# Patient Record
Sex: Female | Born: 1950
Health system: Southern US, Community
[De-identification: ages and names within clinical notes are randomized; demographics above are authoritative.]

## PROBLEM LIST (undated history)

## (undated) DIAGNOSIS — B009 Herpesviral infection, unspecified: Secondary | ICD-10-CM

## (undated) DIAGNOSIS — R112 Nausea with vomiting, unspecified: Secondary | ICD-10-CM

## (undated) DIAGNOSIS — F17200 Nicotine dependence, unspecified, uncomplicated: Secondary | ICD-10-CM

## (undated) DIAGNOSIS — F419 Anxiety disorder, unspecified: Secondary | ICD-10-CM

## (undated) DIAGNOSIS — J439 Emphysema, unspecified: Secondary | ICD-10-CM

## (undated) DIAGNOSIS — Z789 Other specified health status: Secondary | ICD-10-CM

## (undated) DIAGNOSIS — K219 Gastro-esophageal reflux disease without esophagitis: Secondary | ICD-10-CM

## (undated) DIAGNOSIS — Z9889 Other specified postprocedural states: Secondary | ICD-10-CM

## (undated) DIAGNOSIS — T7840XA Allergy, unspecified, initial encounter: Secondary | ICD-10-CM

## (undated) DIAGNOSIS — T8859XA Other complications of anesthesia, initial encounter: Secondary | ICD-10-CM

## (undated) DIAGNOSIS — T4145XA Adverse effect of unspecified anesthetic, initial encounter: Secondary | ICD-10-CM

## (undated) DIAGNOSIS — E785 Hyperlipidemia, unspecified: Secondary | ICD-10-CM

## (undated) DIAGNOSIS — IMO0001 Reserved for inherently not codable concepts without codable children: Secondary | ICD-10-CM

## (undated) DIAGNOSIS — E079 Disorder of thyroid, unspecified: Secondary | ICD-10-CM

## (undated) DIAGNOSIS — M1990 Unspecified osteoarthritis, unspecified site: Secondary | ICD-10-CM

## (undated) HISTORY — PX: DILATION AND CURETTAGE OF UTERUS: SHX78

## (undated) HISTORY — PX: CHOLECYSTECTOMY: SHX55

## (undated) HISTORY — DX: Herpesviral infection, unspecified: B00.9

## (undated) HISTORY — DX: Anxiety disorder, unspecified: F41.9

## (undated) HISTORY — DX: Hyperlipidemia, unspecified: E78.5

## (undated) HISTORY — DX: Disorder of thyroid, unspecified: E07.9

## (undated) HISTORY — DX: Unspecified osteoarthritis, unspecified site: M19.90

## (undated) HISTORY — DX: Nicotine dependence, unspecified, uncomplicated: F17.200

## (undated) HISTORY — DX: Emphysema, unspecified: J43.9

## (undated) HISTORY — DX: Allergy, unspecified, initial encounter: T78.40XA

## (undated) HISTORY — DX: Gastro-esophageal reflux disease without esophagitis: K21.9

---

## 1999-02-22 HISTORY — PX: GALLBLADDER SURGERY: SHX652

## 2000-08-14 ENCOUNTER — Other Ambulatory Visit: Admission: RE | Admit: 2000-08-14 | Discharge: 2000-08-14 | Payer: Self-pay | Admitting: Internal Medicine

## 2001-02-26 ENCOUNTER — Other Ambulatory Visit: Admission: RE | Admit: 2001-02-26 | Discharge: 2001-02-26 | Payer: Self-pay | Admitting: Internal Medicine

## 2005-12-30 ENCOUNTER — Ambulatory Visit (HOSPITAL_COMMUNITY): Admission: RE | Admit: 2005-12-30 | Discharge: 2005-12-30 | Payer: Self-pay | Admitting: Family Medicine

## 2007-12-21 ENCOUNTER — Ambulatory Visit (HOSPITAL_COMMUNITY): Admission: RE | Admit: 2007-12-21 | Discharge: 2007-12-21 | Payer: Self-pay | Admitting: Family Medicine

## 2011-06-02 ENCOUNTER — Encounter: Payer: Self-pay | Admitting: Gastroenterology

## 2011-10-21 ENCOUNTER — Other Ambulatory Visit (HOSPITAL_COMMUNITY): Payer: Self-pay | Admitting: Internal Medicine

## 2011-10-21 DIAGNOSIS — R220 Localized swelling, mass and lump, head: Secondary | ICD-10-CM

## 2011-10-25 ENCOUNTER — Other Ambulatory Visit (HOSPITAL_COMMUNITY): Payer: Self-pay

## 2011-10-28 ENCOUNTER — Ambulatory Visit (HOSPITAL_COMMUNITY)
Admission: RE | Admit: 2011-10-28 | Discharge: 2011-10-28 | Disposition: A | Discharge status: Home / Self Care | Payer: BC Managed Care – PPO | Source: Ambulatory Visit | Attending: Internal Medicine | Admitting: Internal Medicine

## 2011-10-28 DIAGNOSIS — E049 Nontoxic goiter, unspecified: Secondary | ICD-10-CM | POA: Insufficient documentation

## 2011-10-28 DIAGNOSIS — R221 Localized swelling, mass and lump, neck: Secondary | ICD-10-CM | POA: Insufficient documentation

## 2011-10-28 DIAGNOSIS — R220 Localized swelling, mass and lump, head: Secondary | ICD-10-CM | POA: Insufficient documentation

## 2011-11-10 ENCOUNTER — Encounter (INDEPENDENT_AMBULATORY_CARE_PROVIDER_SITE_OTHER): Payer: Self-pay | Admitting: *Deleted

## 2011-11-17 ENCOUNTER — Telehealth: Payer: Self-pay

## 2011-11-28 ENCOUNTER — Encounter (INDEPENDENT_AMBULATORY_CARE_PROVIDER_SITE_OTHER): Payer: Self-pay | Admitting: *Deleted

## 2011-12-07 ENCOUNTER — Other Ambulatory Visit (HOSPITAL_COMMUNITY)
Admission: RE | Admit: 2011-12-07 | Discharge: 2011-12-07 | Disposition: A | Discharge status: Home / Self Care | Payer: BC Managed Care – PPO | Source: Ambulatory Visit | Attending: Endocrinology | Admitting: Endocrinology

## 2011-12-07 ENCOUNTER — Ambulatory Visit (INDEPENDENT_AMBULATORY_CARE_PROVIDER_SITE_OTHER): Payer: BC Managed Care – PPO | Admitting: Endocrinology

## 2011-12-07 ENCOUNTER — Encounter: Payer: Self-pay | Admitting: Endocrinology

## 2011-12-07 VITALS — BP 122/74 | HR 87 | Temp 97.8°F | Wt 122.0 lb

## 2011-12-07 DIAGNOSIS — E041 Nontoxic single thyroid nodule: Secondary | ICD-10-CM | POA: Insufficient documentation

## 2011-12-07 NOTE — Patient Instructions (Addendum)
You will be contacted with biopsy results. If no cancer is seen, please come back for a follow-up appointment in 6-12 months

## 2011-12-07 NOTE — Progress Notes (Signed)
  Subjective:    Patient ID: Monique Lopez, female    DOB: 10-13-1950, 61 y.o.   MRN: [id number]  HPI 1 month ago, pt was incidentally noted on exam to have a slight nodule at the anterior neck.  No assoc pain. No past medical history on file.  No past surgical history on file.  History   Social History  . Marital Status: Widowed    Spouse Name: N/A    Number of Children: N/A  . Years of Education: N/A   Occupational History  . Not on file.   Social History Main Topics  . Smoking status: Current Every Day Smoker -- 1.0 packs/day for 40 years    Types: Cigarettes  . Smokeless tobacco: Not on file  . Alcohol Use: Not on file  . Drug Use: Not on file  . Sexually Active: Not on file   Other Topics Concern  . Not on file   Social History Narrative  . No narrative on file    No current outpatient prescriptions on file prior to visit.    Not on File  No family history on file. No goiter or other thyroid probs BP 122/74  Pulse 87  Temp 97.8 F (36.6 C) (Oral)  Wt 122 lb (55.339 kg)  SpO2 97%   Review of Systems denies headache, hoarseness, double vision, palpitations, diarrhea, polyuria, myalgias, excessive diaphoresis, numbness, tremor, anxiety, menopausal sxs, and easy bruising.  She has rhinorrhea and dry cough.  She has lost weight since the passing of her husband in 2008.      Objective:   Physical Exam VS: see vs page GEN: no distress HEAD: head: no deformity eyes: no periorbital swelling, no proptosis external nose and ears are normal mouth: no lesion seen NECK: supple, thyroid is not enlarged CHEST WALL: no deformity LUNGS:  Clear to auscultation CV: reg rate and rhythm, no murmur ABD: abdomen is soft, nontender.  no hepatosplenomegaly.  not distended.  no hernia MUSCULOSKELETAL: muscle bulk and strength are grossly normal.  no obvious joint swelling.  gait is normal and steady EXTEMITIES: no deformity.  no ulcer on the feet.  feet are of normal  color and temp.  no edema PULSES: dorsalis pedis intact bilat.  no carotid bruit NEURO:  cn 2-12 grossly intact.   readily moves all 4's.  sensation is intact to touch on the feet SKIN:  Normal texture and temperature.  No rash or suspicious lesion is visible.   NODES:  None palpable at the neck PSYCH: alert, oriented x3.  Does not appear anxious nor depressed.   outside test results are reviewed: TSH=normal  thyroid needle bx: consent obtained, signed form on chart The area is first sprayed with cooling agent local: xylocaine 2%, with epinephrine prep: alcohol pad bxs are done with 25 and 27g needles no complications  (i reviewed cytol report: no cancer is seen)    Assessment & Plan:  Thyroid nodule, new, low-risk for cancer Cough, not thyroid-related Weight-loss, not thyroid-related

## 2011-12-11 ENCOUNTER — Encounter: Payer: Self-pay | Admitting: Endocrinology

## 2011-12-11 DIAGNOSIS — T7840XA Allergy, unspecified, initial encounter: Secondary | ICD-10-CM | POA: Insufficient documentation

## 2011-12-11 DIAGNOSIS — E785 Hyperlipidemia, unspecified: Secondary | ICD-10-CM | POA: Insufficient documentation

## 2011-12-14 NOTE — Telephone Encounter (Signed)
Only work numbers. Mailing a letter for pt to call.

## 2011-12-14 NOTE — Telephone Encounter (Signed)
Letter to PCP also.  

## 2012-04-07 ENCOUNTER — Other Ambulatory Visit: Payer: Self-pay

## 2012-06-20 ENCOUNTER — Other Ambulatory Visit (HOSPITAL_COMMUNITY): Payer: Self-pay | Admitting: Family Medicine

## 2012-06-20 DIAGNOSIS — Z139 Encounter for screening, unspecified: Secondary | ICD-10-CM

## 2012-12-27 ENCOUNTER — Other Ambulatory Visit: Payer: Self-pay

## 2013-05-10 ENCOUNTER — Encounter (HOSPITAL_COMMUNITY): Payer: Self-pay | Admitting: Emergency Medicine

## 2013-05-10 ENCOUNTER — Emergency Department (HOSPITAL_COMMUNITY)
Admission: EM | Admit: 2013-05-10 | Discharge: 2013-05-10 | Disposition: A | Discharge status: Home / Self Care | Payer: BC Managed Care – PPO | Attending: Emergency Medicine | Admitting: Emergency Medicine

## 2013-05-10 ENCOUNTER — Emergency Department (HOSPITAL_COMMUNITY): Payer: BC Managed Care – PPO

## 2013-05-10 DIAGNOSIS — R0789 Other chest pain: Secondary | ICD-10-CM

## 2013-05-10 DIAGNOSIS — Y9389 Activity, other specified: Secondary | ICD-10-CM | POA: Insufficient documentation

## 2013-05-10 DIAGNOSIS — M5380 Other specified dorsopathies, site unspecified: Secondary | ICD-10-CM | POA: Insufficient documentation

## 2013-05-10 DIAGNOSIS — Y929 Unspecified place or not applicable: Secondary | ICD-10-CM | POA: Insufficient documentation

## 2013-05-10 DIAGNOSIS — M62838 Other muscle spasm: Secondary | ICD-10-CM

## 2013-05-10 DIAGNOSIS — IMO0002 Reserved for concepts with insufficient information to code with codable children: Secondary | ICD-10-CM | POA: Insufficient documentation

## 2013-05-10 DIAGNOSIS — F17200 Nicotine dependence, unspecified, uncomplicated: Secondary | ICD-10-CM | POA: Insufficient documentation

## 2013-05-10 DIAGNOSIS — R071 Chest pain on breathing: Secondary | ICD-10-CM | POA: Insufficient documentation

## 2013-05-10 DIAGNOSIS — E785 Hyperlipidemia, unspecified: Secondary | ICD-10-CM | POA: Insufficient documentation

## 2013-05-10 DIAGNOSIS — X500XXA Overexertion from strenuous movement or load, initial encounter: Secondary | ICD-10-CM | POA: Insufficient documentation

## 2013-05-10 MED ORDER — METHOCARBAMOL 500 MG PO TABS
500.0000 mg | ORAL_TABLET | Freq: Four times a day (QID) | ORAL | Status: AC
Start: 2013-05-10 — End: 2013-05-20

## 2013-05-10 MED ORDER — TRAMADOL HCL 50 MG PO TABS: 50.0000 mg | ORAL_TABLET | Freq: Four times a day (QID) | ORAL | Status: DC | PRN

## 2013-05-10 NOTE — ED Notes (Signed)
Pain in back and rt ribs for 2 weeks , since moved from passenger side of car to driver side.Today, coughed and felt a pop in rt ribs .and increase in pain in rt ribs.

## 2013-05-10 NOTE — Discharge Instructions (Signed)
Chest Wall Pain Chest wall pain is pain in or around the bones and muscles of your chest. It may take up to 6 weeks to get better. It may take longer if you must stay physically active in your work and activities.  CAUSES  Chest wall pain may happen on its own. However, it may be caused by:  A viral illness like the flu.  Injury.  Coughing.  Exercise.  Arthritis.  Fibromyalgia.  Shingles. HOME CARE INSTRUCTIONS   Avoid overtiring physical activity. Try not to strain or perform activities that cause pain. This includes any activities using your chest or your abdominal and side muscles, especially if heavy weights are used.  Put ice on the sore area.  Put ice in a plastic bag.  Place a towel between your skin and the bag.  Leave the ice on for 15-20 minutes per hour while awake for the first 2 days.  Only take over-the-counter or prescription medicines for pain, discomfort, or fever as directed by your caregiver. SEEK IMMEDIATE MEDICAL CARE IF:   Your pain increases, or you are very uncomfortable.  You have a fever.  Your chest pain becomes worse.  You have new, unexplained symptoms.  You have nausea or vomiting.  You feel sweaty or lightheaded.  You have a cough with phlegm (sputum), or you cough up blood. MAKE SURE YOU:   Understand these instructions.  Will watch your condition.  Will get help right away if you are not doing well or get worse. Document Released: 02/07/2005 Document Revised: 05/02/2011 Document Reviewed: 10/04/2010 [facility] Patient Information 2014 [place], [place].   Your x-rays are negative to date for any sign of bony injury (fracture).  You may use the medications prescribed if needed for pain and for muscle spasm.  Continue applying ice and heat as you are alternating.  Follow up with your doctor if not improved over the next 7-10 days.

## 2013-05-10 NOTE — ED Notes (Signed)
Pt c/o pain in right rib cage and right middle back after hearing a "pop" today.

## 2013-05-13 NOTE — ED Provider Notes (Signed)
CSN: [id number]     Arrival date & time 05/10/13  1146 History   First MD Initiated Contact with Patient 05/10/13 1211     Chief Complaint  Patient presents with  . Back Pain     (Consider location/radiation/quality/duration/timing/severity/associated sxs/prior Treatment) HPI Comments: Monique Lopez is a 63 y.o. Female presenting with pain in her right lateral back along with a "pulling" muscle spasm sensation which started 2 weeks ago as she was moving from the passenger to the driver seat of her car. Today her pain worsened in her right mid rib cage feeling a popping sensation  When she coughed.  She denies shortness of breath, cough, chest pain, dizziness, palpitations or diaphoresis although she does have increased pain with deep inspiration.  She denies any traumas and does not have osteoporosis, to her knowledge.  She has taken ibuprofen  And has alternated ice and heat prior to arrival with no significant improvement in pain which is worsened by palpation and movement.     The history is provided by the patient.    Past Medical History  Diagnosis Date  . Thyroid disease   . Allergy   . Hyperlipidemia    Past Surgical History  Procedure Laterality Date  . Gallbladder surgery  2001   Family History  Problem Relation Age of Onset  . Arthritis Mother   . Stroke Mother    History  Substance Use Topics  . Smoking status: Current Every Day Smoker -- 1.00 packs/day for 40 years    Types: Cigarettes  . Smokeless tobacco: Current User  . Alcohol Use: No   OB History   Grav Para Term Preterm Abortions TAB SAB Ect Mult Living                 Review of Systems  Constitutional: Negative for fever.  Respiratory: Negative for cough and shortness of breath.   Cardiovascular: Negative for chest pain.  Gastrointestinal: Negative for nausea and vomiting.  Musculoskeletal: Positive for arthralgias and back pain. Negative for joint swelling and myalgias.  Neurological: Negative  for weakness and numbness.      Allergies  Review of patient's allergies indicates no known allergies.  Home Medications   Current Outpatient Rx  Name  Route  Sig  Dispense  Refill  . ALPRAZolam (XANAX) 0.25 MG tablet   Oral   Take 0.25 mg by mouth at bedtime as needed.         Marland Kitchen ibuprofen (ADVIL,MOTRIN) 200 MG tablet   Oral   Take 400 mg by mouth every 6 (six) hours as needed for moderate pain.         . methocarbamol (ROBAXIN) 500 MG tablet   Oral   Take 1 tablet (500 mg total) by mouth 4 (four) times daily.   20 tablet   0   . traMADol (ULTRAM) 50 MG tablet   Oral   Take 1 tablet (50 mg total) by mouth every 6 (six) hours as needed.   15 tablet   0    BP 162/82  Pulse 86  Temp(Src) 98.8 F (37.1 C)  Resp 18  Ht 5\' 7"  (1.702 m)  Wt 142 lb (64.411 kg)  BMI 22.24 kg/m2  SpO2 99% Physical Exam  Nursing note and vitals reviewed. Constitutional: She appears well-developed and well-nourished.  HENT:  Head: Normocephalic.  Eyes: Conjunctivae are normal.  Neck: Normal range of motion. Neck supple.  Cardiovascular: Normal rate, regular rhythm and intact distal pulses.  Pedal pulses normal.  Pulmonary/Chest: Effort normal and breath sounds normal. No respiratory distress. She has no decreased breath sounds. She has no wheezes. She has no rhonchi. She has no rales.   She exhibits tenderness. She exhibits no edema, no deformity and no swelling.    Abdominal: Soft. Bowel sounds are normal. She exhibits no distension and no mass.  Musculoskeletal: Normal range of motion. She exhibits no edema.       Thoracic back: She exhibits spasm. She exhibits no tenderness and no bony tenderness.       Lumbar back: She exhibits no tenderness, no swelling, no edema and no spasm.  Neurological: She is alert. She has normal strength. She displays no atrophy and no tremor. No sensory deficit. Gait normal.  Reflex Scores:      Patellar reflexes are 2+ on the right side and 2+ on  the left side.      Achilles reflexes are 2+ on the right side and 2+ on the left side. No strength deficit noted in hip and knee flexor and extensor muscle groups.  Ankle flexion and extension intact.  Skin: Skin is warm and dry.  Psychiatric: She has a normal mood and affect.    ED Course  Procedures (including critical care time) Labs Review Labs Reviewed - No data to display Imaging Review No results found.   EKG Interpretation None      MDM   Final diagnoses:  Chest wall pain  Muscle spasm    Patients labs and/or radiological studies were viewed and considered during the medical decision making and disposition process. xrays negative for acute fx.  Pain is reproducible, most suggestive of body wall pain.  No sob, doubt PE.  Non tender thoracic spine.  Pt was prescribed robaxin, tramadol.  Encouraged continuing  alternate ice and heat.  F/u with pcp for  Recheck if not better over the next 7-10 days.  The patient appears reasonably screened and/or stabilized for discharge and I doubt any other medical condition or other [facility] requiring further screening, evaluation, or treatment in the ED at this time prior to discharge.    Evalee Jefferson, PA-C 05/13/13 1517

## 2013-05-15 NOTE — ED Provider Notes (Signed)
Medical screening examination/treatment/procedure(s) were performed by non-physician practitioner and as supervising physician I was immediately available for consultation/collaboration.   EKG Interpretation None          Orpah Greek, MD 05/15/13 [phone]

## 2013-06-19 IMAGING — US US SOFT TISSUE HEAD/NECK
1 series · 14 of 25 positions shown · non-contrast
Comparison: No comparison studies available.

CLINICAL DATA: Thyroid nodule.

THYROID ULTRASOUND
TECHNIQUE: Ultrasound examination of the thyroid gland and
adjacent soft tissues was performed.

[Series 1: us soft tissue head/neck · 0.06mm/px · 14 of 45 slices shown]
[im 1/45]
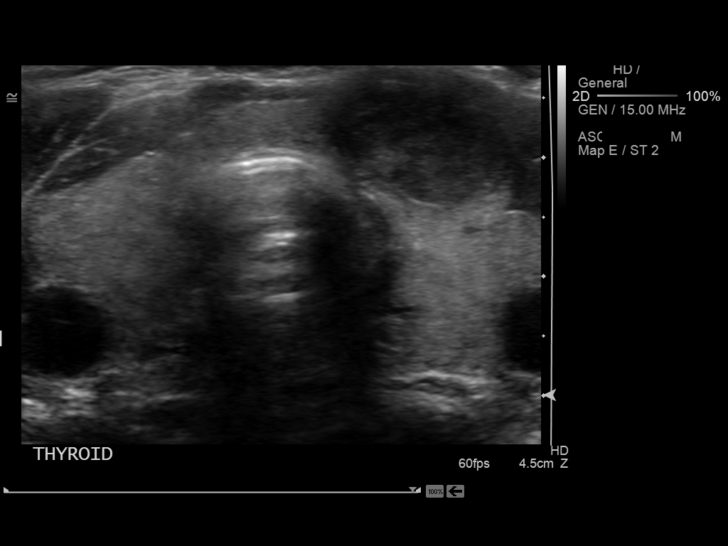
[im 4/45]
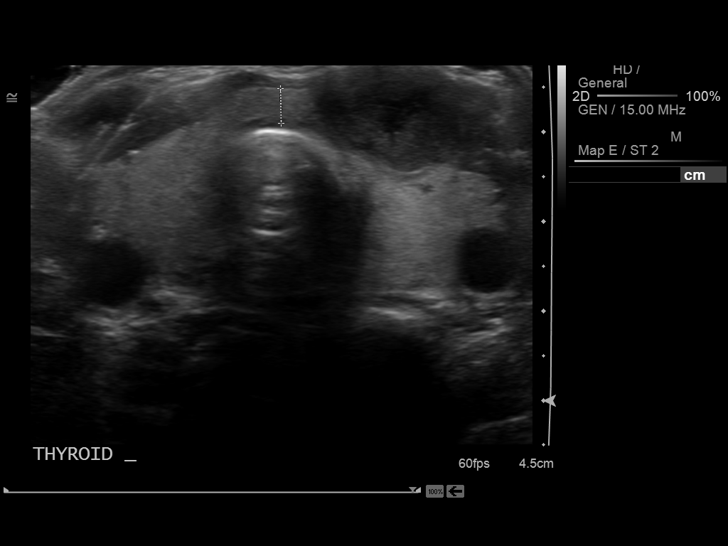
[im 8/45]
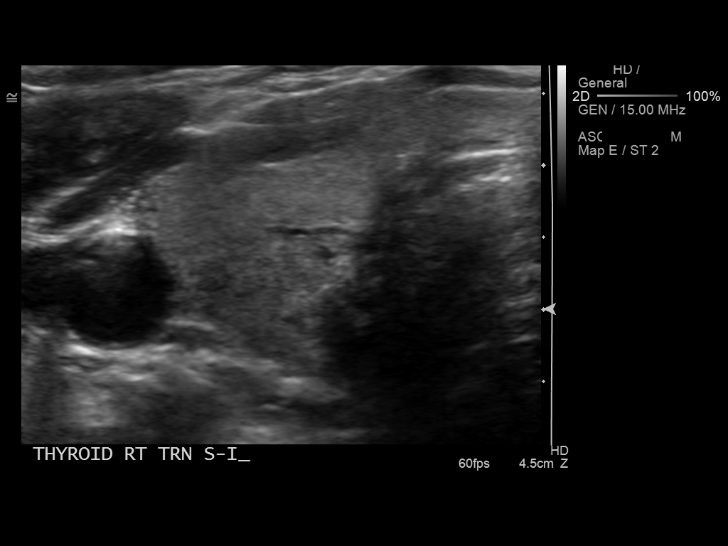
[im 12/45]
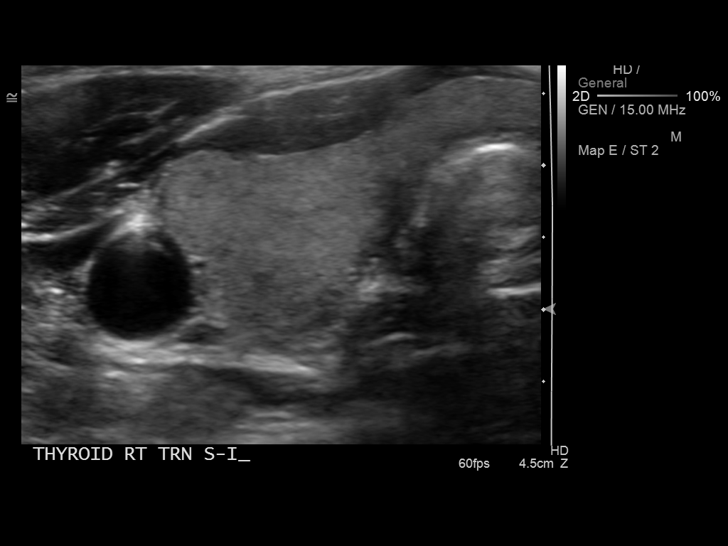
[im 15/45]
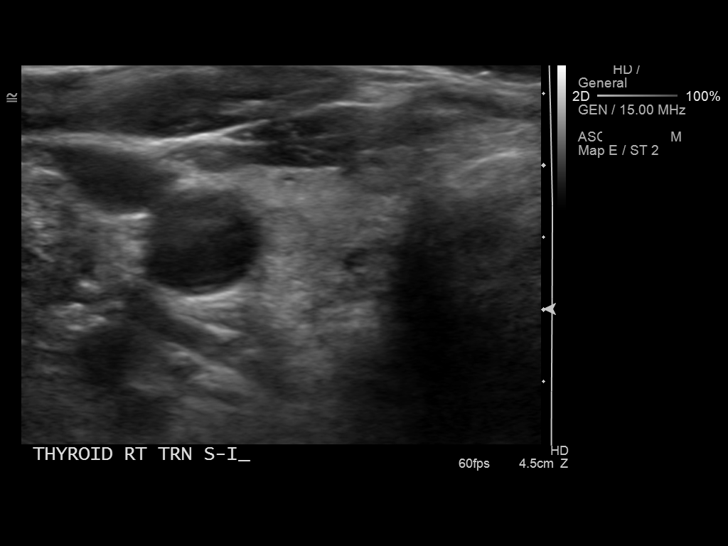
[im 17/45]
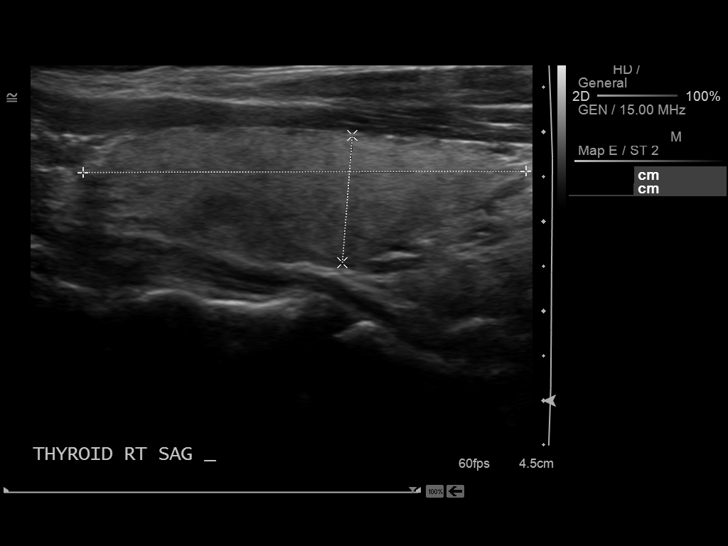
[im 21/45]
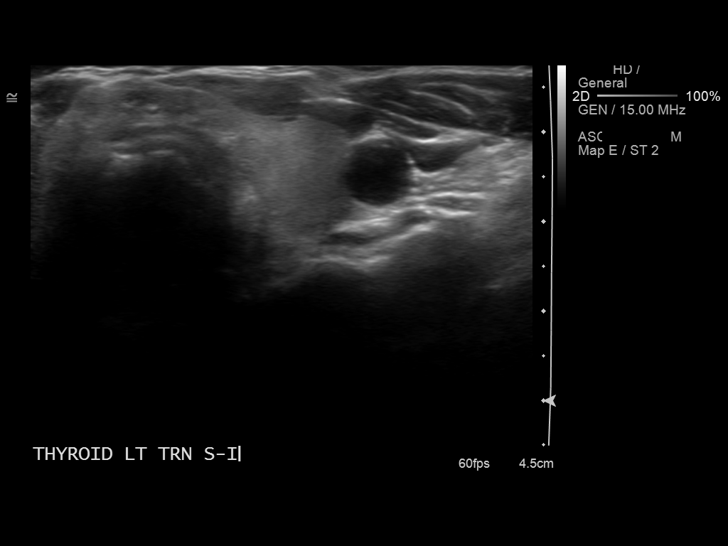
[im 24/45]
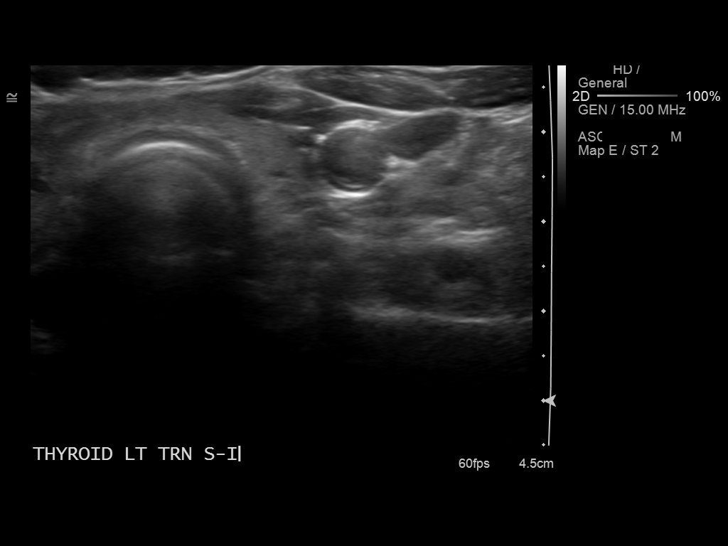
[im 28/45]
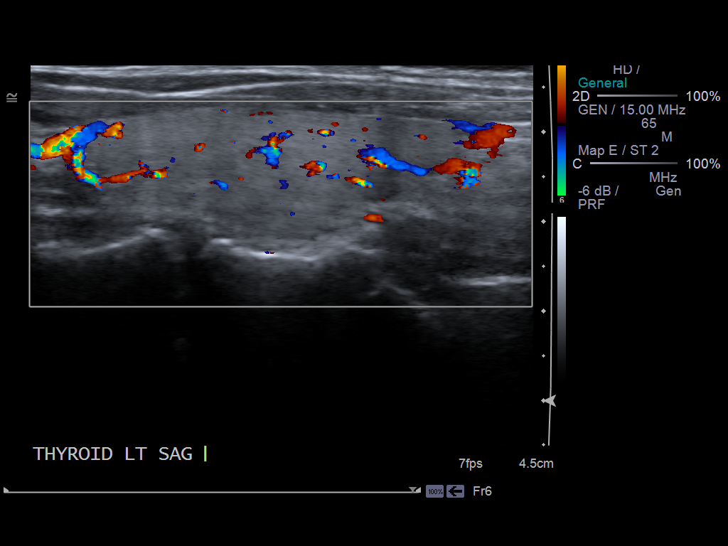
[im 30/45]
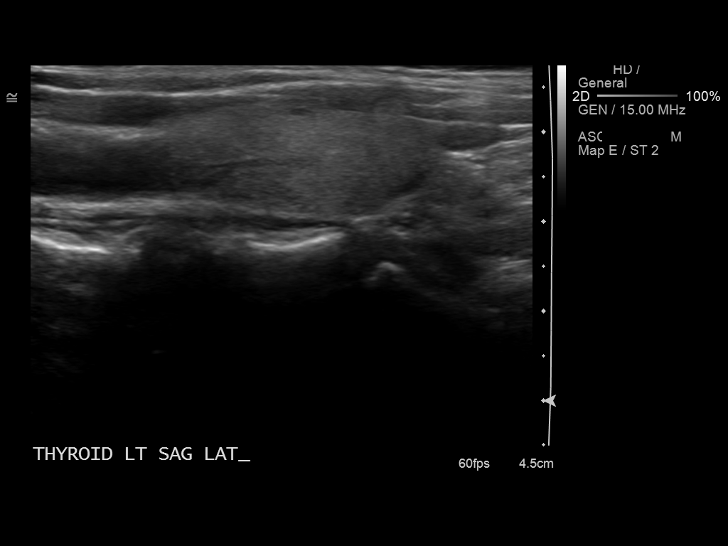
[im 34/45]
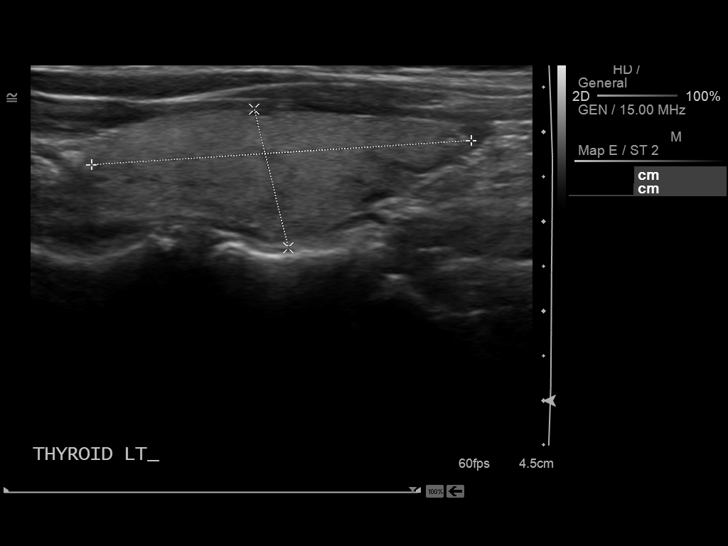
[im 37/45]
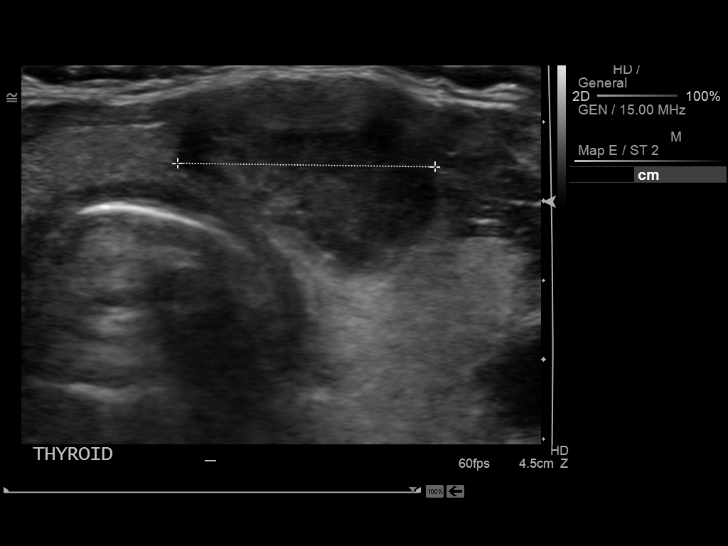
[im 41/45]
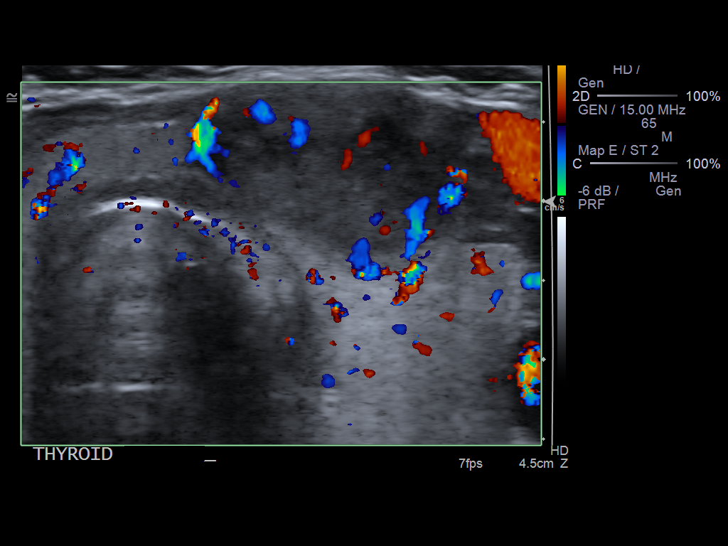
[im 45/45]
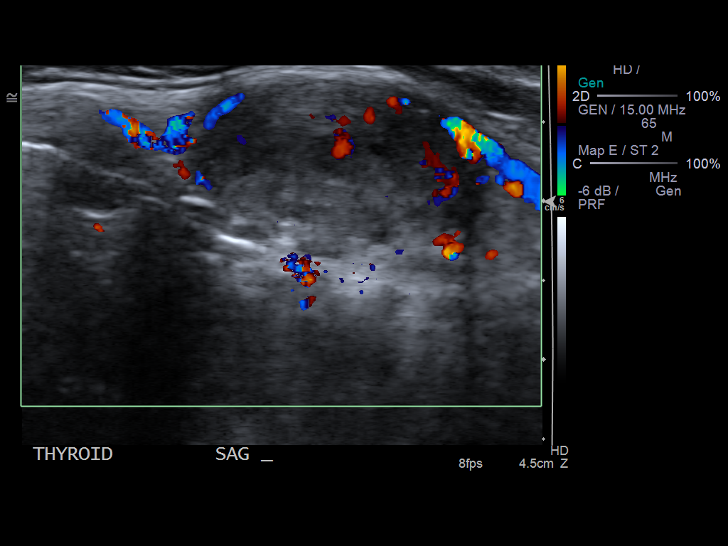

[14 of 25 positions shown; findings below may reference images not displayed]

FINDINGS: The right thyroid lobe measures 5.0 x 1.4 x 1.5 cm.  The left lobe
measures 4.3 x 1.6 x 1.3 cm.  The isthmus is 4 mm in thickness.

A single thyroid nodule is identified.  This is a 2.0 x 1.0 x
cm heterogeneous, solid nodule in the left aspect of the isthmus.
No associated microcalcification or hypoechoic halo is evident.
IMPRESSION: 2 cm heterogeneous solid nodule in the left aspect of the isthmus.
Based on size criteria, tissue sampling is recommended. This
recommendation follows the consensus statement:  Management of
Thyroid Nodules Detected as US:  Society of Radiologists in
800.

## 2015-03-05 ENCOUNTER — Encounter: Payer: Self-pay | Admitting: Gastroenterology

## 2015-03-17 ENCOUNTER — Observation Stay (HOSPITAL_COMMUNITY)
Admission: EM | Admit: 2015-03-17 | Discharge: 2015-03-18 | Disposition: A | Discharge status: Home / Self Care | Payer: BLUE CROSS/BLUE SHIELD | Attending: Internal Medicine | Admitting: Internal Medicine

## 2015-03-17 ENCOUNTER — Emergency Department (HOSPITAL_COMMUNITY): Payer: BLUE CROSS/BLUE SHIELD

## 2015-03-17 ENCOUNTER — Encounter (HOSPITAL_COMMUNITY): Payer: Self-pay | Admitting: Emergency Medicine

## 2015-03-17 DIAGNOSIS — R7989 Other specified abnormal findings of blood chemistry: Secondary | ICD-10-CM | POA: Diagnosis not present

## 2015-03-17 DIAGNOSIS — R748 Abnormal levels of other serum enzymes: Secondary | ICD-10-CM | POA: Insufficient documentation

## 2015-03-17 DIAGNOSIS — I2489 Other forms of acute ischemic heart disease: Secondary | ICD-10-CM | POA: Diagnosis present

## 2015-03-17 DIAGNOSIS — R197 Diarrhea, unspecified: Secondary | ICD-10-CM

## 2015-03-17 DIAGNOSIS — R079 Chest pain, unspecified: Secondary | ICD-10-CM | POA: Insufficient documentation

## 2015-03-17 DIAGNOSIS — K529 Noninfective gastroenteritis and colitis, unspecified: Secondary | ICD-10-CM | POA: Diagnosis not present

## 2015-03-17 DIAGNOSIS — R778 Other specified abnormalities of plasma proteins: Secondary | ICD-10-CM | POA: Diagnosis present

## 2015-03-17 DIAGNOSIS — F17210 Nicotine dependence, cigarettes, uncomplicated: Secondary | ICD-10-CM | POA: Diagnosis not present

## 2015-03-17 DIAGNOSIS — J329 Chronic sinusitis, unspecified: Secondary | ICD-10-CM | POA: Insufficient documentation

## 2015-03-17 DIAGNOSIS — D72829 Elevated white blood cell count, unspecified: Secondary | ICD-10-CM

## 2015-03-17 DIAGNOSIS — I248 Other forms of acute ischemic heart disease: Secondary | ICD-10-CM | POA: Diagnosis present

## 2015-03-17 DIAGNOSIS — R112 Nausea with vomiting, unspecified: Secondary | ICD-10-CM | POA: Diagnosis not present

## 2015-03-17 DIAGNOSIS — Z79899 Other long term (current) drug therapy: Secondary | ICD-10-CM | POA: Insufficient documentation

## 2015-03-17 DIAGNOSIS — E785 Hyperlipidemia, unspecified: Secondary | ICD-10-CM | POA: Diagnosis present

## 2015-03-17 LAB — CBC WITH DIFFERENTIAL/PLATELET
BASOS ABS: 0 10*3/uL (ref 0.0–0.1)
BASOS PCT: 0 %
EOS PCT: 1 %
Eosinophils Absolute: 0.1 10*3/uL (ref 0.0–0.7)
HEMATOCRIT: 45.1 % (ref 36.0–46.0)
Hemoglobin: 15 g/dL (ref 12.0–15.0)
Lymphocytes Relative: 3 %
Lymphs Abs: 0.6 10*3/uL — ABNORMAL LOW (ref 0.7–4.0)
MCH: 30.6 pg (ref 26.0–34.0)
MCHC: 33.3 g/dL (ref 30.0–36.0)
MCV: 92 fL (ref 78.0–100.0)
MONO ABS: 1.2 10*3/uL — AB (ref 0.1–1.0)
Monocytes Relative: 6 %
NEUTROS ABS: 16.9 10*3/uL — AB (ref 1.7–7.7)
Neutrophils Relative %: 90 %
PLATELETS: 242 10*3/uL (ref 150–400)
RBC: 4.9 MIL/uL (ref 3.87–5.11)
RDW: 13.6 % (ref 11.5–15.5)
WBC: 18.8 10*3/uL — ABNORMAL HIGH (ref 4.0–10.5)

## 2015-03-17 LAB — COMPREHENSIVE METABOLIC PANEL
ALBUMIN: 4.1 g/dL (ref 3.5–5.0)
ALT: 13 U/L — ABNORMAL LOW (ref 14–54)
AST: 13 U/L — AB (ref 15–41)
Alkaline Phosphatase: 79 U/L (ref 38–126)
Anion gap: 8 (ref 5–15)
BUN: 19 mg/dL (ref 6–20)
CHLORIDE: 106 mmol/L (ref 101–111)
CO2: 27 mmol/L (ref 22–32)
Calcium: 9.1 mg/dL (ref 8.9–10.3)
Creatinine, Ser: 0.78 mg/dL (ref 0.44–1.00)
GFR calc Af Amer: >60 mL/min (ref >60)
GFR calc non Af Amer: >60 mL/min (ref >60)
GLUCOSE: 122 mg/dL — AB (ref 65–99)
POTASSIUM: 4.6 mmol/L (ref 3.5–5.1)
SODIUM: 141 mmol/L (ref 135–145)
Total Bilirubin: 0.5 mg/dL (ref 0.3–1.2)
Total Protein: 7.2 g/dL (ref 6.5–8.1)

## 2015-03-17 LAB — URINALYSIS, ROUTINE W REFLEX MICROSCOPIC
Bilirubin Urine: NEGATIVE
GLUCOSE, UA: NEGATIVE mg/dL
Hgb urine dipstick: NEGATIVE
LEUKOCYTES UA: NEGATIVE
Nitrite: NEGATIVE
PH: 5 (ref 5.0–8.0)
Protein, ur: NEGATIVE mg/dL
Specific Gravity, Urine: >1.03 — ABNORMAL HIGH (ref 1.005–1.030)

## 2015-03-17 LAB — I-STAT TROPONIN, ED: Troponin i, poc: 0 ng/mL (ref 0.00–0.08)

## 2015-03-17 LAB — TROPONIN I
TROPONIN I: 0.05 ng/mL — AB (ref <0.031)
TROPONIN I: 0.05 ng/mL — AB (ref <0.031)

## 2015-03-17 LAB — LIPASE, BLOOD: Lipase: 32 U/L (ref 11–51)

## 2015-03-17 MED ORDER — ONDANSETRON HCL 4 MG/2ML IJ SOLN
4.0000 mg | Freq: Once | INTRAMUSCULAR | Status: AC
  Administered 2015-03-17: 4 mg via INTRAVENOUS
  Filled 2015-03-17: qty 2

## 2015-03-17 MED ORDER — ONDANSETRON HCL 4 MG/2ML IJ SOLN: 4.0000 mg | Freq: Once | INTRAMUSCULAR | Status: DC

## 2015-03-17 MED ORDER — ASPIRIN 81 MG PO CHEW
324.0000 mg | CHEWABLE_TABLET | Freq: Once | ORAL | Status: AC
  Administered 2015-03-17: 324 mg via ORAL
  Filled 2015-03-17: qty 4

## 2015-03-17 MED ORDER — SODIUM CHLORIDE 0.9 % IV BOLUS (SEPSIS)
1000.0000 mL | Freq: Once | INTRAVENOUS | Status: AC
  Administered 2015-03-17: 1000 mL via INTRAVENOUS

## 2015-03-17 NOTE — ED Provider Notes (Signed)
CSN: [id number]     Arrival date & time 03/17/15  1621 History   First MD Initiated Contact with Patient 03/17/15 1640     Chief Complaint  Patient presents with  . Emesis  . Diarrhea     (Consider location/radiation/quality/duration/timing/severity/associated sxs/prior Treatment) HPI  65 year old female who presents with N/V/D. History of thyroid disease and hyperlipidemia.  States sinus congestions for past few days, and seen by PCP yesterday for this. This morning, began to feel unwell at around 2-3 PM. Started with multiple episodes vomiting followed by diarrhea. During this time also became very diaphoretic prior to vomiting. States that she had intermittent chest pressure after vomiting. No shortness of breath, syncope or near syncope. Denies any melena or hematochezia. Denies any associating hematemesis. No abdominal pain, dysuria, urinary frequency.  No known sick contacts. Was started on antibiotics yesterday by PCP for concern for sinusitis. Has only had one dose today.  Past Medical History  Diagnosis Date  . Thyroid disease   . Allergy   . Hyperlipidemia    Past Surgical History  Procedure Laterality Date  . Gallbladder surgery  2001   Family History  Problem Relation Age of Onset  . Arthritis Mother   . Stroke Mother    Social History  Substance Use Topics  . Smoking status: Current Every Day Smoker -- 1.00 packs/day for 40 years    Types: Cigarettes  . Smokeless tobacco: Current User  . Alcohol Use: No   OB History    No data available     Review of Systems 10/14 systems reviewed and are negative other than those stated in the HPI   Allergies  Review of patient's allergies indicates no known allergies.  Home Medications   Prior to Admission medications   Medication Sig Start Date End Date Taking? Authorizing Provider  ALPRAZolam (XANAX) 0.25 MG tablet Take 0.25 mg by mouth 2 (two) times daily as needed for anxiety or sleep.    Yes Historical  Provider, MD  amoxicillin-clavulanate (AUGMENTIN) 875-125 MG tablet Take 1 tablet by mouth 2 (two) times daily. 10 day course starting on 03/16/2015   Yes Historical Provider, MD  chlorpheniramine-HYDROcodone (TUSSIONEX PENNKINETIC ER) 10-8 MG/5ML SUER Take 5 mLs by mouth every 12 (twelve) hours as needed for cough.   Yes Historical Provider, MD  hydroxypropyl methylcellulose / hypromellose (ISOPTO TEARS / GONIOVISC) 2.5 % ophthalmic solution Place 1 drop into both eyes daily as needed for dry eyes.   Yes Historical Provider, MD  PROAIR RESPICLICK [id number] (90 Base) MCG/ACT AEPB INHALE 2 PUFFS EVERY 4 TO 6 HOURS 01/13/15  Yes Historical Provider, MD   BP 121/67 mmHg  Pulse 86  Temp(Src) 98.1 F (36.7 C) (Oral)  Resp 18  Ht 5\' 7"  (1.702 m)  Wt 139 lb 6.4 oz (63.231 kg)  BMI 21.83 kg/m2  SpO2 97% Physical Exam Physical Exam  Nursing note and vitals reviewed. Constitutional: Low energy, non-toxic, and in no acute distress Head: Normocephalic and atraumatic.  Mouth/Throat: Oropharynx is clear and moist.  Neck: Normal range of motion. Neck supple.  Cardiovascular: Normal rate and regular rhythm.   Pulmonary/Chest: Effort normal and breath sounds normal.  Abdominal: Soft. There is no tenderness. There is no rebound and no guarding.  Musculoskeletal: Normal range of motion.  Neurological: Alert, no facial droop, fluent speech, moves all extremities symmetrically Skin: Skin is warm and dry.  Psychiatric: Cooperative  ED Course  Procedures (including critical care time) Labs Review Labs Reviewed  CBC WITH DIFFERENTIAL/PLATELET - Abnormal; Notable for the following:    WBC 18.8 (*)    Neutro Abs 16.9 (*)    Lymphs Abs 0.6 (*)    Monocytes Absolute 1.2 (*)    All other components within normal limits  COMPREHENSIVE METABOLIC PANEL - Abnormal; Notable for the following:    Glucose, Bld 122 (*)    AST 13 (*)    ALT 13 (*)    All other components within normal limits  URINALYSIS, ROUTINE W  REFLEX MICROSCOPIC (NOT AT [facility]) - Abnormal; Notable for the following:    Specific Gravity, Urine >1.030 (*)    Ketones, ur TRACE (*)    All other components within normal limits  TROPONIN I - Abnormal; Notable for the following:    Troponin I 0.05 (*)    All other components within normal limits  TROPONIN I - Abnormal; Notable for the following:    Troponin I 0.05 (*)    All other components within normal limits  TROPONIN I - Abnormal; Notable for the following:    Troponin I 0.04 (*)    All other components within normal limits  TROPONIN I - Abnormal; Notable for the following:    Troponin I 0.04 (*)    All other components within normal limits  LIPID PANEL - Abnormal; Notable for the following:    LDL Cholesterol 120 (*)    All other components within normal limits  LIPASE, BLOOD  CBC  TROPONIN I  Randolm Idol, ED    Imaging Review Dg Chest 2 View  03/17/2015  CLINICAL DATA:  Patient with chest pain and weakness. Nausea vomiting and diarrhea. EXAM: CHEST  2 VIEW COMPARISON:  Chest radiograph 05/10/2013. FINDINGS: Multiple monitoring leads overlie the patient. Stable cardiac and mediastinal contours. No consolidative pulmonary opacities. Biapical pleural parenchymal thickening. Pulmonary hyperinflation. Unremarkable osseous skeleton. IMPRESSION: Pulmonary hyperinflation.  No acute cardiopulmonary process. Electronically Signed   By: Lovey Newcomer M.D.   On: 03/17/2015 18:26   I have personally reviewed and evaluated these images and lab results as part of my medical decision-making.   EKG Interpretation   Date/Time:  Tuesday March 17 2015 17:22:59 EST Ventricular Rate:  87 PR Interval:  148 QRS Duration: 87 QT Interval:  395 QTC Calculation: 475 R Axis:   78 Text Interpretation:  Sinus rhythm Biatrial enlargement No prior for  comparison  Confirmed by Quinlee Sciarra MD, Hinton Dyer [id number]) on 03/17/2015 6:10:07 PM      MDM   Final diagnoses:  Nausea vomiting and diarrhea  Chest  pain, unspecified chest pain type  Elevated troponin    65 year old female who presents with episode of chest pain and diaphoresis in setting of N/V/D. VS stable on presentation. Is pain free but actively vomiting at bedside. Abdomen benign and cardiopulmonary exam unremarkable. Presentation seem consistent with benign GI illness.   EKG is non-ischemic. CXR without acute cardiopulmonary processes. Troponin is mildly elevated 0.05, and remains elevated to 0.05 on serial trending. Remainder of basic blood work notable for leukcyotsis, likely from GI illness.  Story seems atypical for that of ACS. Given aspirin. Discussed with Dr. Shanon Brow who will [facility]-up. Discussed with Dr. Meda Coffee from Cardiology, who recommended transfer to [facility] for Cardiology consult.   Forde Dandy, MD 03/18/15 1249

## 2015-03-17 NOTE — ED Notes (Signed)
Patient complaining of vomiting and diarrhea starting this morning.

## 2015-03-17 NOTE — H&P (Signed)
PCP:   Glo Herring., MD   Chief Complaint:  N/v/d  HPI: 65 yo female with h/o hld comes in with one day of several episodes of vomiting nonbloody with diarrhea.  No fevers at home.  No sick contacts.  She did have some pain in her chest when she was vomiting earlier but none since.  Denies abdominal pain.  Pt referred for admission for her chest pain and elevation of troponin level.  Review of Systems:  Positive and negative as per HPI otherwise all other systems are negative  Past Medical History: Past Medical History  Diagnosis Date  . Thyroid disease   . Allergy   . Hyperlipidemia    Past Surgical History  Procedure Laterality Date  . Gallbladder surgery  2001    Medications: Prior to Admission medications   Medication Sig Start Date End Date Taking? Authorizing Provider  ALPRAZolam (XANAX) 0.25 MG tablet Take 0.25 mg by mouth 2 (two) times daily as needed for anxiety or sleep.    Yes Historical Provider, MD  amoxicillin-clavulanate (AUGMENTIN) 875-125 MG tablet Take 1 tablet by mouth 2 (two) times daily. 10 day course starting on 03/16/2015   Yes Historical Provider, MD  chlorpheniramine-HYDROcodone (TUSSIONEX PENNKINETIC ER) 10-8 MG/5ML SUER Take 5 mLs by mouth every 12 (twelve) hours as needed for cough.   Yes Historical Provider, MD  hydroxypropyl methylcellulose / hypromellose (ISOPTO TEARS / GONIOVISC) 2.5 % ophthalmic solution Place 1 drop into both eyes daily as needed for dry eyes.   Yes Historical Provider, MD  PROAIR RESPICLICK 123XX123 (90 Base) MCG/ACT AEPB INHALE 2 PUFFS EVERY 4 TO 6 HOURS 01/13/15  Yes Historical Provider, MD    Allergies:  No Known Allergies  Social History:  reports that she has been smoking Cigarettes.  She has a 40 pack-year smoking history. She uses smokeless tobacco. She reports that she does not drink alcohol or use illicit drugs.  Family History: Family History  Problem Relation Age of Onset  . Arthritis Mother   . Stroke Mother      Physical Exam: Filed Vitals:   03/17/15 2228 03/17/15 2230 03/17/15 2247 03/17/15 2300  BP:  124/71  110/54  Pulse: 89 94 102 87  Temp:      TempSrc:      Resp: 21 29 13 25   Height:      Weight:      SpO2: 95% 96% 96% 94%   General appearance: alert, cooperative, appears older than stated age and no distress Head: Normocephalic, without obvious abnormality, atraumatic Eyes: negative Nose: Nares normal. Septum midline. Mucosa normal. No drainage or sinus tenderness. Neck: no JVD and supple, symmetrical, trachea midline Lungs: clear to auscultation bilaterally Heart: regular rate and rhythm, S1, S2 normal, no murmur, click, rub or gallop Abdomen: soft, non-tender; bowel sounds normal; no masses,  no organomegaly Extremities: extremities normal, atraumatic, no cyanosis or edema Pulses: 2+ and symmetric Skin: Skin color, texture, turgor normal. No rashes or lesions Neurologic: Grossly normal    Labs on Admission:   Recent Labs  03/17/15 1748  NA 141  K 4.6  CL 106  CO2 27  GLUCOSE 122*  BUN 19  CREATININE 0.78  CALCIUM 9.1    Recent Labs  03/17/15 1748  AST 13*  ALT 13*  ALKPHOS 79  BILITOT 0.5  PROT 7.2  ALBUMIN 4.1    Recent Labs  03/17/15 1748  LIPASE 32    Recent Labs  03/17/15 1748  WBC 18.8*  NEUTROABS  16.9*  HGB 15.0  HCT 45.1  MCV 92.0  PLT 242    Recent Labs  03/17/15 1748 03/17/15 2042  TROPONINI 0.05* 0.05*   Radiological Exams on Admission: Dg Chest 2 View  03/17/2015  CLINICAL DATA:  Patient with chest pain and weakness. Nausea vomiting and diarrhea. EXAM: CHEST  2 VIEW COMPARISON:  Chest radiograph 05/10/2013. FINDINGS: Multiple monitoring leads overlie the patient. Stable cardiac and mediastinal contours. No consolidative pulmonary opacities. Biapical pleural parenchymal thickening. Pulmonary hyperinflation. Unremarkable osseous skeleton. IMPRESSION: Pulmonary hyperinflation.  No acute cardiopulmonary process.  Electronically Signed   By: Lovey Newcomer M.D.   On: 03/17/2015 18:26   ekg reviewed nsr no acute issues  Assessment/Plan  65 yo female with likely viral gastroenteritis with elevation of troponin  Principal Problem:   Pain in the chest- in setting of vomiting.  Resolved now with resolution of vomiting.  Trop 0.05 x 2 occasions.  [facility] for [facility] evallation.  Serial trop overnight.  No anticoagulation at this time.  Check cardiac echo in am.  Further treatment and work up per cardiology team at [place] [facility].  Active Problems:   Hyperlipidemia   Elevated troponin   Nausea vomiting and diarrhea- likely viral.  Supportive care.  Seems to have mostly resolved already.  obs on tele.  Full code.  DAVID,RACHAL A 03/17/2015, 11:20 PM

## 2015-03-18 ENCOUNTER — Observation Stay (HOSPITAL_BASED_OUTPATIENT_CLINIC_OR_DEPARTMENT_OTHER): Payer: BLUE CROSS/BLUE SHIELD

## 2015-03-18 ENCOUNTER — Encounter (HOSPITAL_COMMUNITY): Payer: Self-pay | Admitting: General Practice

## 2015-03-18 DIAGNOSIS — E785 Hyperlipidemia, unspecified: Secondary | ICD-10-CM | POA: Diagnosis not present

## 2015-03-18 DIAGNOSIS — R0789 Other chest pain: Secondary | ICD-10-CM

## 2015-03-18 DIAGNOSIS — J329 Chronic sinusitis, unspecified: Secondary | ICD-10-CM | POA: Diagnosis not present

## 2015-03-18 DIAGNOSIS — I248 Other forms of acute ischemic heart disease: Secondary | ICD-10-CM | POA: Diagnosis not present

## 2015-03-18 DIAGNOSIS — R197 Diarrhea, unspecified: Secondary | ICD-10-CM | POA: Diagnosis present

## 2015-03-18 DIAGNOSIS — R7989 Other specified abnormal findings of blood chemistry: Secondary | ICD-10-CM

## 2015-03-18 DIAGNOSIS — F17210 Nicotine dependence, cigarettes, uncomplicated: Secondary | ICD-10-CM | POA: Diagnosis not present

## 2015-03-18 DIAGNOSIS — Z79899 Other long term (current) drug therapy: Secondary | ICD-10-CM | POA: Diagnosis not present

## 2015-03-18 DIAGNOSIS — K529 Noninfective gastroenteritis and colitis, unspecified: Secondary | ICD-10-CM

## 2015-03-18 DIAGNOSIS — R112 Nausea with vomiting, unspecified: Secondary | ICD-10-CM

## 2015-03-18 DIAGNOSIS — R748 Abnormal levels of other serum enzymes: Secondary | ICD-10-CM | POA: Diagnosis not present

## 2015-03-18 DIAGNOSIS — R079 Chest pain, unspecified: Secondary | ICD-10-CM

## 2015-03-18 DIAGNOSIS — D72829 Elevated white blood cell count, unspecified: Secondary | ICD-10-CM

## 2015-03-18 LAB — LIPID PANEL
CHOL/HDL RATIO: 3.9 ratio
Cholesterol: 192 mg/dL (ref 0–200)
HDL: 49 mg/dL (ref >40)
LDL CALC: 120 mg/dL — AB (ref 0–99)
TRIGLYCERIDES: 114 mg/dL (ref <150)
VLDL: 23 mg/dL (ref 0–40)

## 2015-03-18 LAB — CBC
HEMATOCRIT: 43 % (ref 36.0–46.0)
Hemoglobin: 14.2 g/dL (ref 12.0–15.0)
MCH: 30.4 pg (ref 26.0–34.0)
MCHC: 33 g/dL (ref 30.0–36.0)
MCV: 92.1 fL (ref 78.0–100.0)
PLATELETS: 245 10*3/uL (ref 150–400)
RBC: 4.67 MIL/uL (ref 3.87–5.11)
RDW: 14.1 % (ref 11.5–15.5)
WBC: 10.3 10*3/uL (ref 4.0–10.5)

## 2015-03-18 LAB — TROPONIN I
TROPONIN I: 0.04 ng/mL — AB (ref <0.031)
Troponin I: 0.04 ng/mL — ABNORMAL HIGH (ref <0.031)
Troponin I: 0.04 ng/mL — ABNORMAL HIGH (ref <0.031)

## 2015-03-18 MED ORDER — ONDANSETRON HCL 4 MG/2ML IJ SOLN: 4.0000 mg | Freq: Four times a day (QID) | INTRAMUSCULAR | Status: DC | PRN

## 2015-03-18 MED ORDER — MORPHINE SULFATE (PF) 2 MG/ML IV SOLN: 2.0000 mg | INTRAVENOUS | Status: DC | PRN

## 2015-03-18 MED ORDER — HYDROCOD POLST-CPM POLST ER 10-8 MG/5ML PO SUER
5.0000 mL | Freq: Two times a day (BID) | ORAL | Status: DC | PRN
  Administered 2015-03-18: 5 mL via ORAL
  Filled 2015-03-18: qty 5

## 2015-03-18 MED ORDER — SODIUM CHLORIDE 0.9 % IV SOLN
3.0000 g | Freq: Three times a day (TID) | INTRAVENOUS | Status: DC
  Administered 2015-03-18: 3 g via INTRAVENOUS
  Filled 2015-03-18 (×2): qty 3

## 2015-03-18 MED ORDER — ATORVASTATIN CALCIUM 80 MG PO TABS
80.0000 mg | ORAL_TABLET | Freq: Every day | ORAL | Status: DC
  Filled 2015-03-18: qty 1

## 2015-03-18 MED ORDER — SACCHAROMYCES BOULARDII 250 MG PO CAPS: 250.0000 mg | ORAL_CAPSULE | Freq: Two times a day (BID) | ORAL | Status: DC

## 2015-03-18 MED ORDER — AMOXICILLIN-POT CLAVULANATE 875-125 MG PO TABS
1.0000 | ORAL_TABLET | Freq: Once | ORAL | Status: AC
  Administered 2015-03-18: 1 via ORAL
  Filled 2015-03-18: qty 1

## 2015-03-18 MED ORDER — ASPIRIN EC 81 MG PO TBEC: 81.0000 mg | DELAYED_RELEASE_TABLET | Freq: Every day | ORAL | Status: DC

## 2015-03-18 MED ORDER — SACCHAROMYCES BOULARDII 250 MG PO CAPS
250.0000 mg | ORAL_CAPSULE | Freq: Two times a day (BID) | ORAL | Status: DC
  Administered 2015-03-18: 250 mg via ORAL
  Filled 2015-03-18: qty 1

## 2015-03-18 MED ORDER — GI COCKTAIL ~~LOC~~: 30.0000 mL | Freq: Four times a day (QID) | ORAL | Status: DC | PRN

## 2015-03-18 MED ORDER — ASPIRIN 81 MG PO CHEW
81.0000 mg | CHEWABLE_TABLET | Freq: Every day | ORAL | Status: DC
  Administered 2015-03-18: 81 mg via ORAL
  Filled 2015-03-18: qty 1

## 2015-03-18 MED ORDER — ACETAMINOPHEN 325 MG PO TABS: 650.0000 mg | ORAL_TABLET | ORAL | Status: DC | PRN

## 2015-03-18 MED ORDER — ATORVASTATIN CALCIUM 40 MG PO TABS: 40.0000 mg | ORAL_TABLET | Freq: Every day | ORAL | Status: AC

## 2015-03-18 MED ORDER — ASPIRIN EC 325 MG PO TBEC: 325.0000 mg | DELAYED_RELEASE_TABLET | Freq: Every day | ORAL | Status: DC

## 2015-03-18 NOTE — Progress Notes (Signed)
  Echocardiogram 2D Echocardiogram has been performed.  Jennette Dubin 03/18/2015, 10:39 AM

## 2015-03-18 NOTE — Discharge Summary (Addendum)
Physician Discharge Summary  Monique Lopez [id number] DOB: August 13, 1950 DOA: 03/17/2015  PCP: Glo Herring., MD  Admit date: 03/17/2015 Discharge date: 03/18/2015  Recommendations for Outpatient Follow-up:  1. F/u with primary care doctor in 1-2 weeks for review of sinusitis.  F/u pending A1c please. 2. F/u with cardiology in 2-4 weeks or sooner as needed for possible stress test.  Started aspirin and atorvastatin pending outpatient evaluation.  Discharge Diagnoses:  Principal Problem:   Pain in the chest Active Problems:   Hyperlipidemia   Elevated troponin   Nausea vomiting and diarrhea   Discharge Condition: stable, improved  Diet recommendation: healthy heart  Wt Readings from Last 3 Encounters:  03/18/15 63.231 kg (139 lb 6.4 oz)  05/10/13 64.411 kg (142 lb)  12/07/11 55.339 kg (122 lb)    History of present illness:  65 yo female with h/o hld who presented with a one day history of frequent nonbloody emesis and nonbloody diarrhea. She had recently started antibiotics for a sinus infection.  No fevers at home. No sick contacts. She did have some pain in her chest when she was vomiting which was not associated with SOB and which resolved with her nausea. Denied abdominal pain. Pt referred for admission for her chest pain and elevation of troponin level.  Hospital Course:   Gastroenteritis - I suspect that she was exposed to a virus because her vomiting and diarrhea resolved within 12 hours with supportive care only.  Her leukocytosis also resolved.  Her symptoms may have been exacerbated by her Augmentin which can sometimes cause softer or frequent stools and mild nausea, however, she was able to tolerate this medication prior to discharge without serious side effects.  She did not have other hallmarks of drug allergy such as wheezing, hypotension, hives, or swelling.  She was given a prescription for florastor.    Sinusitis, being managed by her PCP.  Continued  antibiotics.    Chest pain, likely related to her vomiting, either from GERD or from muscle spasm and resolved.  Although her troponins were mildly elevated, they were flat and this likely was secondary to demand ischemia.  She was started on aspirin Her ECG demonstrated no ischemic changes.  Telemetry:  NSR without arrhythmia.  ECHO demonstrated a preserved EF without wall motion abnormality.  Her LDL was 120 and she was started on atorvastatin 40mg  daily pending follow up with cardiology.  A1c is pending.  She should follow up with cardiology for further evaluation of possible underlying CAD within 2-4 weeks or sooner if she has recurrent chest pain.    Procedures:  ECHO  Consultations:  Cardiology, Dr. Geralynn Ochs  Discharge Exam: Filed Vitals:   03/18/15 0053 03/18/15 0537  BP: 129/66 121/67  Pulse: 83 86  Temp: 98.8 F (37.1 C) 98.1 F (36.7 C)  Resp: 20 18   Filed Vitals:   03/17/15 2330 03/18/15 0053 03/18/15 0204 03/18/15 0537  BP: 115/70 129/66  121/67  Pulse:  83  86  Temp:  98.8 F (37.1 C)  98.1 F (36.7 C)  TempSrc:  Oral  Oral  Resp: 24 20  18   Height:   5\' 7"  (1.702 m)   Weight:   63.912 kg (140 lb 14.4 oz) 63.231 kg (139 lb 6.4 oz)  SpO2:  96%  97%    General: adult female, NAD Cardiovascular: RRR, no mrg,  Respiratory: CTAB ABD:  Hyperactive BS, soft, ND/NT MSK:  No LEE Neuro:  Grossly intact  Discharge Instructions  Medication List    ASK your doctor about these medications        ALPRAZolam 0.25 MG tablet  Commonly known as:  XANAX  Take 0.25 mg by mouth 2 (two) times daily as needed for anxiety or sleep.     amoxicillin-clavulanate 875-125 MG tablet  Commonly known as:  AUGMENTIN  Take 1 tablet by mouth 2 (two) times daily. 10 day course starting on 03/16/2015     hydroxypropyl methylcellulose / hypromellose 2.5 % ophthalmic solution  Commonly known as:  ISOPTO TEARS / GONIOVISC  Place 1 drop into both eyes daily as needed for dry  eyes.     PROAIR RESPICLICK 123XX123 (90 Base) MCG/ACT Aepb  Generic drug:  Albuterol Sulfate  INHALE 2 PUFFS EVERY 4 TO 6 HOURS     TUSSIONEX PENNKINETIC ER 10-8 MG/5ML Suer  Generic drug:  chlorpheniramine-HYDROcodone  Take 5 mLs by mouth every 12 (twelve) hours as needed for cough.          The results of significant diagnostics from this hospitalization (including imaging, microbiology, ancillary and laboratory) are listed below for reference.    Significant Diagnostic Studies: Dg Chest 2 View  03/17/2015  CLINICAL DATA:  Patient with chest pain and weakness. Nausea vomiting and diarrhea. EXAM: CHEST  2 VIEW COMPARISON:  Chest radiograph 05/10/2013. FINDINGS: Multiple monitoring leads overlie the patient. Stable cardiac and mediastinal contours. No consolidative pulmonary opacities. Biapical pleural parenchymal thickening. Pulmonary hyperinflation. Unremarkable osseous skeleton. IMPRESSION: Pulmonary hyperinflation.  No acute cardiopulmonary process. Electronically Signed   By: Lovey Newcomer M.D.   On: 03/17/2015 18:26    Microbiology: No results found for this or any previous visit (from the past 240 hour(s)).   Labs: Basic Metabolic Panel:  Recent Labs Lab 03/17/15 1748  NA 141  K 4.6  CL 106  CO2 27  GLUCOSE 122*  BUN 19  CREATININE 0.78  CALCIUM 9.1   Liver Function Tests:  Recent Labs Lab 03/17/15 1748  AST 13*  ALT 13*  ALKPHOS 79  BILITOT 0.5  PROT 7.2  ALBUMIN 4.1    Recent Labs Lab 03/17/15 1748  LIPASE 32   No results for input(s): AMMONIA in the last 168 hours. CBC:  Recent Labs Lab 03/17/15 1748 03/18/15 0900  WBC 18.8* 10.3  NEUTROABS 16.9*  --   HGB 15.0 14.2  HCT 45.1 43.0  MCV 92.0 92.1  PLT 242 245   Cardiac Enzymes:  Recent Labs Lab 03/17/15 1748 03/17/15 2042 03/18/15 0216 03/18/15 0835  TROPONINI 0.05* 0.05* 0.04* 0.04*   BNP: BNP (last 3 results) No results for input(s): BNP in the last 8760 hours.  ProBNP (last 3  results) No results for input(s): PROBNP in the last 8760 hours.  CBG: No results for input(s): GLUCAP in the last 168 hours.  Time coordinating discharge: 35 minutes  Signed:  Evalyne Cortopassi  [facility] 03/18/2015, 12:54 PM

## 2015-03-18 NOTE — Progress Notes (Signed)
    Consult note from earlier today reviewed. Agree.  Await ECHO read Trop flat, low level - demand ischemia in setting of GI illness.  Candee Furbish, MD

## 2015-03-18 NOTE — Progress Notes (Signed)
ANTIBIOTIC CONSULT NOTE - INITIAL  Pharmacy Consult for Unasyn  Indication: Sinusitis  No Known Allergies  Patient Measurements: Height: 5\' 7"  (170.2 cm) Weight: 139 lb 6.4 oz (63.231 kg) IBW/kg (Calculated) : 61.6  Vital Signs: Temp: 98.1 F (36.7 C) (01/25 0537) Temp Source: Oral (01/25 0537) BP: 121/67 mmHg (01/25 0537) Pulse Rate: 86 (01/25 0537)  Labs:  Recent Labs  03/17/15 1748  WBC 18.8*  HGB 15.0  PLT 242  CREATININE 0.78   Estimated Creatinine Clearance: 70.9 mL/min (by C-G formula based on Cr of 0.78).  Medical History: Past Medical History  Diagnosis Date  . Thyroid disease   . Allergy   . Hyperlipidemia     Assessment: 65 y/o F with sinus infection, N/V/D, starting Unasyn per pharmacy, WBC is elevated, renal function good, afebrile, other labs reviewed.   Plan:  -Unasyn 3g IV q8h -Trend WBC, temp, renal function  -Drug levels as indicated   Narda Bonds 03/18/2015,7:11 AM

## 2015-03-18 NOTE — Progress Notes (Signed)
   03/18/15 0053  Vitals  Temp 98.8 F (37.1 C)  Temp Source Oral  BP 129/66 mmHg  BP Location Right Arm  BP Method Automatic  Patient Position (if appropriate) Sitting  Pulse Rate 83  Pulse Rate Source Dinamap  Resp 20  Oxygen Therapy  SpO2 96 %  O2 Device Room Air  Admitted a pt to rm 2W35 from AP, pt alert and oriented, denied pain at this time, oriented to room, call bell placed within reach, orders carried out. Daughter at bedside. Will continue to monitor.

## 2015-03-18 NOTE — Progress Notes (Signed)
Discussed with patient and daughter discharge instructions, both verbalized agreement and understanding.  Patient's IV was discontinued with no complications.  Patient to go down in wheelchair with all belongings to go home in private vehicle.

## 2015-03-18 NOTE — Consult Note (Signed)
CARDIOLOGY INPATIENT CONSULTATION NOTE  Patient ID: CAMARI SCHWANDT MRN: [id number], DOB/AGE: Jul 19, 1950   Admit date: 03/17/2015   Primary Physician: Glo Herring., MD Primary Cardiologist: none  Reason for Consult:   Elevated troponin  Requesting Physician: Forde Dandy MD  HPI: This is a 65 y.o. female with no known history of CAD but has history of hypothyroidism, current smoker and hyperlipidemia who presented with nausea, vomiting and diarrhea.  Patient was in her usual state of health on 1/23 2 days ago with some sinus infection and she received a steroid shot and received augmentin for sinus infection. She took it yesterday after breakfast when she started feeling nauseas and around 2 pm she felt much more nauseas. She started throwing up at that time and felt chest pain while she was throwing up. She also developed non bloody diarrhea x 3-4. She was having associated diaphoresis and weakness. In the ED, she did not have any chest pain. The chest pain only occurred when she threw up. She is saying that she did not really have chest pain. It was just feeling of throwing up aggressively.    Problem List: Past Medical History  Diagnosis Date  . Thyroid disease   . Allergy   . Hyperlipidemia     Past Surgical History  Procedure Laterality Date  . Gallbladder surgery  2001     Allergies: No Known Allergies   Home Medications Current Facility-Administered Medications  Medication Dose Route Frequency Provider Last Rate Last Dose  . acetaminophen (TYLENOL) tablet 650 mg  650 mg Oral Q4H PRN Phillips Grout, MD      . aspirin EC tablet 325 mg  325 mg Oral Daily Phillips Grout, MD      . gi cocktail (Maalox,Lidocaine,Donnatal)  30 mL Oral QID PRN Phillips Grout, MD      . morphine 2 MG/ML injection 2 mg  2 mg Intravenous Q2H PRN Phillips Grout, MD      . ondansetron (ZOFRAN) injection 4 mg  4 mg Intravenous Q6H PRN Phillips Grout, MD         Family History  Problem  Relation Age of Onset  . Arthritis Mother   . Stroke Mother      Social History   Social History  . Marital Status: Widowed    Spouse Name: N/A  . Number of Children: N/A  . Years of Education: N/A   Occupational History  . Not on file.   Social History Main Topics  . Smoking status: Current Every Day Smoker -- 1.00 packs/day for 40 years    Types: Cigarettes  . Smokeless tobacco: Current User  . Alcohol Use: No  . Drug Use: No  . Sexual Activity: Yes   Other Topics Concern  . Not on file   Social History Narrative     Review of Systems: General: fatigue increase weight negative for chills, fever, night sweats  Cardiovascular: chest discomfort  Dermatological: negative for rash Respiratory: productive cough, clear sputum negative for wheezing  Urologic: negative for hematuria Abdominal: nausea, vomiting and non bloody diarrhea negative for bright red blood per rectum, melena, or hematemesis Neurologic: lightheaded negative for visual changes, syncope, or dizziness Hematology: no anemia Psychiatry: non suicidal/homicidal  Musculoskeletal: no back pain   Physical Exam: Vitals: BP 129/66 mmHg  Pulse 83  Temp(Src) 98.8 F (37.1 C) (Oral)  Resp 20  Ht 5\' 7"  (1.702 m)  Wt 63.912 kg (140 lb 14.4 oz)  BMI 22.06 kg/m2  SpO2 96% General: not in acute distress Neck: JVP flat, neck supple Heart: regular rate and rhythm, S1, S2, no murmurs  Lungs: CTAB  GI: non tender, non distended, bowel sounds present Extremities: no edema Neuro: AAO x 3  Psych: normal affect, no anxiety   Labs:   Results for orders placed or performed during the hospital encounter of 03/17/15 (from the past 24 hour(s))  I-Stat Troponin, ED (not at [facility])     Status: None   Collection Time: 03/17/15  5:29 PM  Result Value Ref Range   Troponin i, poc 0.00 0.00 - 0.08 ng/mL   Comment 3          CBC with Differential     Status: Abnormal   Collection Time: 03/17/15  5:48 PM  Result Value Ref  Range   WBC 18.8 (H) 4.0 - 10.5 K/uL   RBC 4.90 3.87 - 5.11 MIL/uL   Hemoglobin 15.0 12.0 - 15.0 g/dL   HCT 45.1 36.0 - 46.0 %   MCV 92.0 78.0 - 100.0 fL   MCH 30.6 26.0 - 34.0 pg   MCHC 33.3 30.0 - 36.0 g/dL   RDW 13.6 11.5 - 15.5 %   Platelets 242 150 - 400 K/uL   Neutrophils Relative % 90 %   Neutro Abs 16.9 (H) 1.7 - 7.7 K/uL   Lymphocytes Relative 3 %   Lymphs Abs 0.6 (L) 0.7 - 4.0 K/uL   Monocytes Relative 6 %   Monocytes Absolute 1.2 (H) 0.1 - 1.0 K/uL   Eosinophils Relative 1 %   Eosinophils Absolute 0.1 0.0 - 0.7 K/uL   Basophils Relative 0 %   Basophils Absolute 0.0 0.0 - 0.1 K/uL  Comprehensive metabolic panel     Status: Abnormal   Collection Time: 03/17/15  5:48 PM  Result Value Ref Range   Sodium 141 135 - 145 mmol/L   Potassium 4.6 3.5 - 5.1 mmol/L   Chloride 106 101 - 111 mmol/L   CO2 27 22 - 32 mmol/L   Glucose, Bld 122 (H) 65 - 99 mg/dL   BUN 19 6 - 20 mg/dL   Creatinine, Ser 0.78 0.44 - 1.00 mg/dL   Calcium 9.1 8.9 - 10.3 mg/dL   Total Protein 7.2 6.5 - 8.1 g/dL   Albumin 4.1 3.5 - 5.0 g/dL   AST 13 (L) 15 - 41 U/L   ALT 13 (L) 14 - 54 U/L   Alkaline Phosphatase 79 38 - 126 U/L   Total Bilirubin 0.5 0.3 - 1.2 mg/dL   GFR calc non Af Amer >60 >60 mL/min   GFR calc Af Amer >60 >60 mL/min   Anion gap 8 5 - 15  Lipase, blood     Status: None   Collection Time: 03/17/15  5:48 PM  Result Value Ref Range   Lipase 32 11 - 51 U/L  Troponin I     Status: Abnormal   Collection Time: 03/17/15  5:48 PM  Result Value Ref Range   Troponin I 0.05 (H) <0.031 ng/mL  Urinalysis, Routine w reflex microscopic (not at [facility])     Status: Abnormal   Collection Time: 03/17/15  6:43 PM  Result Value Ref Range   Color, Urine YELLOW YELLOW   APPearance CLEAR CLEAR   Specific Gravity, Urine >1.030 (H) 1.005 - 1.030   pH 5.0 5.0 - 8.0   Glucose, UA NEGATIVE NEGATIVE mg/dL   Hgb urine dipstick NEGATIVE NEGATIVE   Bilirubin Urine  NEGATIVE NEGATIVE   Ketones, ur TRACE (A)  NEGATIVE mg/dL   Protein, ur NEGATIVE NEGATIVE mg/dL   Nitrite NEGATIVE NEGATIVE   Leukocytes, UA NEGATIVE NEGATIVE  Troponin I     Status: Abnormal   Collection Time: 03/17/15  8:42 PM  Result Value Ref Range   Troponin I 0.05 (H) <0.031 ng/mL  Troponin I (q 6hr x 3)     Status: Abnormal   Collection Time: 03/18/15  2:16 AM  Result Value Ref Range   Troponin I 0.04 (H) <0.031 ng/mL     Radiology/Studies: Dg Chest 2 View  03/17/2015  CLINICAL DATA:  Patient with chest pain and weakness. Nausea vomiting and diarrhea. EXAM: CHEST  2 VIEW COMPARISON:  Chest radiograph 05/10/2013. FINDINGS: Multiple monitoring leads overlie the patient. Stable cardiac and mediastinal contours. No consolidative pulmonary opacities. Biapical pleural parenchymal thickening. Pulmonary hyperinflation. Unremarkable osseous skeleton. IMPRESSION: Pulmonary hyperinflation.  No acute cardiopulmonary process. Electronically Signed   By: Lovey Newcomer M.D.   On: 03/17/2015 18:26    EKG: normal sinus rhythm, normal axis and no ST/T wave changes  Echo: none  Cardiac cath: none  Medical decision making:  Discussed care with the patient Reviewed labs and imaging personally Reviewed prior records  ASSESSMENT AND PLAN:  This is a 65 y.o. female with no prior history who developed nausea vomiting likely secondary to augmentin side effects developed chest discomfort with nausea and vomiting. She has borderline troponin elevation and was admitted for management of elevated troponin, nausea and vomiting.    Principal Problem:   Pain in the chest Active Problems:   Hyperlipidemia   Elevated troponin   Nausea vomiting and diarrhea   Atypical cardiac chest pain, with borderline elevated troponin which are flat Likely demand ischemia in the setting of elevated white count, nausea/vomiting Cycle troponin Serial EKGs NPO post midnight  Lipid panel, HbA1c, TSH Aspirin and statin  Echocardiogram in  AM  Dyslipidemia Lipid panel, high dose statin  Nausea/vomiting Treat underlying cause Keep hydrated     Signed, Flossie Dibble, MD MS 03/18/2015, 4:59 AM

## 2015-03-20 ENCOUNTER — Other Ambulatory Visit (HOSPITAL_COMMUNITY): Payer: Self-pay | Admitting: Family Medicine

## 2015-03-20 DIAGNOSIS — R319 Hematuria, unspecified: Secondary | ICD-10-CM

## 2015-03-26 ENCOUNTER — Ambulatory Visit (HOSPITAL_COMMUNITY): Payer: BLUE CROSS/BLUE SHIELD

## 2015-04-21 ENCOUNTER — Encounter: Payer: Self-pay | Admitting: Internal Medicine

## 2015-05-04 ENCOUNTER — Encounter: Payer: Self-pay | Admitting: Gastroenterology

## 2015-09-23 ENCOUNTER — Other Ambulatory Visit (HOSPITAL_COMMUNITY): Payer: Self-pay | Admitting: Internal Medicine

## 2015-09-23 DIAGNOSIS — Z1231 Encounter for screening mammogram for malignant neoplasm of breast: Secondary | ICD-10-CM

## 2015-09-25 ENCOUNTER — Ambulatory Visit (HOSPITAL_COMMUNITY)
Admission: RE | Admit: 2015-09-25 | Discharge: 2015-09-25 | Disposition: A | Discharge status: Home / Self Care | Payer: BLUE CROSS/BLUE SHIELD | Source: Ambulatory Visit | Attending: Internal Medicine | Admitting: Internal Medicine

## 2015-09-25 DIAGNOSIS — Z1231 Encounter for screening mammogram for malignant neoplasm of breast: Secondary | ICD-10-CM

## 2015-11-11 ENCOUNTER — Ambulatory Visit: Payer: BLUE CROSS/BLUE SHIELD | Admitting: *Deleted

## 2015-11-11 VITALS — Ht 65.5 in | Wt 148.2 lb

## 2015-11-11 DIAGNOSIS — Z1211 Encounter for screening for malignant neoplasm of colon: Secondary | ICD-10-CM

## 2015-11-11 MED ORDER — SUPREP BOWEL PREP KIT 17.5-3.13-1.6 GM/177ML PO SOLN: 1.0000 | Freq: Once | ORAL | 0 refills | Status: AC

## 2015-11-11 NOTE — Progress Notes (Signed)
Patient denies any allergies to egg or soy products. Patient had PONV with anesthesia (gallbladder).  Patient denies oxygen use at home and denies diet medications. Emmi instructions for colonoscopyexplained but patient denied, pamphlet given.

## 2015-11-13 ENCOUNTER — Encounter: Payer: Self-pay | Admitting: Gastroenterology

## 2015-11-23 ENCOUNTER — Encounter: Payer: Self-pay | Admitting: Gastroenterology

## 2015-11-23 ENCOUNTER — Telehealth: Payer: Self-pay | Admitting: Gastroenterology

## 2015-11-23 ENCOUNTER — Ambulatory Visit (AMBULATORY_SURGERY_CENTER): Payer: BLUE CROSS/BLUE SHIELD | Admitting: Gastroenterology

## 2015-11-23 VITALS — BP 155/86 | HR 72 | Temp 97.8°F | Resp 15 | Ht 65.0 in | Wt 148.0 lb

## 2015-11-23 DIAGNOSIS — Z1211 Encounter for screening for malignant neoplasm of colon: Secondary | ICD-10-CM

## 2015-11-23 MED ORDER — SODIUM CHLORIDE 0.9 % IV SOLN: 500.0000 mL | INTRAVENOUS | Status: DC

## 2015-11-23 NOTE — Progress Notes (Signed)
To PACU, vss patent aw report to rn 

## 2015-11-23 NOTE — Telephone Encounter (Signed)
Patient called this a.m states that yesterday after taking the prep she had vomiting and diarrhea. She called the after hours number spoke to someone, she is unsure who it was. Instructed to drink this a.m at 9:00 64 ounces of gatorade and take Miralax. She is not sure how much to Miralax to take. She did continue to have several diarrhea bouts last night. Instructed to use one capful 17 gm of Miralax. She will be leaving her house at 12:00 to be here at 1:00 for procedure.

## 2015-11-23 NOTE — Patient Instructions (Signed)
YOU HAD AN ENDOSCOPIC PROCEDURE TODAY AT [facility]:   Refer to the procedure report that was given to you for any specific questions about what was found during the examination.  If the procedure report does not answer your questions, please call your gastroenterologist to clarify.  If you requested that your care partner not be given the details of your procedure findings, then the procedure report has been included in a sealed envelope for you to review at your convenience later.  YOU SHOULD EXPECT: Some feelings of bloating in the abdomen. Passage of more gas than usual.  Walking can help get rid of the air that was put into your GI tract during the procedure and reduce the bloating. If you had a lower endoscopy (such as a colonoscopy or flexible sigmoidoscopy) you may notice spotting of blood in your stool or on the toilet paper. If you underwent a bowel prep for your procedure, you may not have a normal bowel movement for a few days.  Please Note:  You might notice some irritation and congestion in your nose or some drainage.  This is from the oxygen used during your procedure.  There is no need for concern and it should clear up in a day or so.  SYMPTOMS TO REPORT IMMEDIATELY:   Following lower endoscopy (colonoscopy or flexible sigmoidoscopy):  Excessive amounts of blood in the stool  Significant tenderness or worsening of abdominal pains  Swelling of the abdomen that is new, acute  Fever of 100F or higher   For urgent or emergent issues, a gastroenterologist can be reached at any hour by calling [phone].   DIET:  We do recommend a small meal at first, but then you may proceed to your regular diet.  Drink plenty of fluids but you should avoid alcoholic beverages for 24 hours.  ACTIVITY:  You should plan to take it easy for the rest of today and you should NOT DRIVE or use heavy machinery until tomorrow (because of the sedation medicines used during the test).     FOLLOW UP: Our staff will call the number listed on your records the next business day following your procedure to check on you and address any questions or concerns that you may have regarding the information given to you following your procedure. If we do not reach you, we will leave a message.  However, if you are feeling well and you are not experiencing any problems, there is no need to return our call.  We will assume that you have returned to your regular daily activities without incident.  If any biopsies were taken you will be contacted by phone or by letter within the next 1-3 weeks.  Please call us at [phone] if you have not heard about the biopsies in 3 weeks.    SIGNATURES/CONFIDENTIALITY: You and/or your care partner have signed paperwork which will be entered into your electronic medical record.  These signatures attest to the fact that that the information above on your After Visit Summary has been reviewed and is understood.  Full responsibility of the confidentiality of this discharge information lies with you and/or your care-partner.  Normal exam.  hemorrhoids-handout given  Repeat colonoscopy in 10 years 2027.

## 2015-11-23 NOTE — Op Note (Signed)
[place] Patient Name: Monique Lopez Procedure Date: 11/23/2015 1:56 PM MRN: [id number] Endoscopist: Mallie Mussel L. Loletha Carrow , MD Age: 65 Referring MD:  Date of Birth: June 13, 1950 Gender: Female Account #: [id number] Procedure:                Colonoscopy Indications:              Screening for colorectal malignant neoplasm, This                            is the patient's first colonoscopy Medicines:                Monitored Anesthesia Care Procedure:                Pre-Anesthesia Assessment:                           - Prior to the procedure, a History and Physical                            was performed, and patient medications and                            allergies were reviewed. The patient's tolerance of                            previous anesthesia was also reviewed. The risks                            and benefits of the procedure and the sedation                            options and risks were discussed with the patient.                            All questions were answered, and informed consent                            was obtained. Anticoagulants: The patient has taken                            aspirin. It was decided not to withhold this                            medication prior to the procedure. ASA Grade                            Assessment: II - A patient with mild systemic                            disease. After reviewing the risks and benefits,                            the patient was deemed in satisfactory condition to  undergo the procedure.                           After obtaining informed consent, the colonoscope                            was passed under direct vision. Throughout the                            procedure, the patient's blood pressure, pulse, and                            oxygen saturations were monitored continuously. The                            Model CF-HQ190L [id number]) scope was introduced                            through the anus and advanced to the the cecum,                            identified by appendiceal orifice and ileocecal                            valve. The Model PCF-H190L [id number]) scope was                            introduced through the and advanced to the. The                            colonoscopy was technically difficult and complex                            due to a tortuous colon. Successful completion of                            the procedure was aided by changing the patient to                            a supine position and withdrawing the scope and                            replacing with the pediatric colonoscope. The                            patient tolerated the procedure well. The quality                            of the bowel preparation was good. The ileocecal                            valve, appendiceal orifice, and rectum were  photographed. The quality of the bowel preparation                            was evaluated using the BBPS [facility] Bowel                            Preparation Scale) with scores of: Right Colon = 2,                            Transverse Colon = 2 and Left Colon = 2. The total                            BBPS score equals 6. The bowel preparation used was                            Miralax and SUPREP. The patient vomited after the                            evening dose of Suprep and took a single dose of                            miralax and a large amount of sports drink for AM                            prep. Scope In: 2:00:49 PM Scope Out: 2:31:14 PM Scope Withdrawal Time: 0 hours 10 minutes 15 seconds  Total Procedure Duration: 0 hours 30 minutes 25 seconds  Findings:                 The perianal and digital rectal examinations were                            normal.                           Many small and large-mouthed diverticula were found                             in the left colon. See above - there was marked                            associated tortuosity making scope passage                            challenging. The pediatric scope was eventually                            passed.                           Internal hemorrhoids were found during anoscopy.                            The hemorrhoids were small and Grade I (internal  hemorrhoids that do not prolapse).                           The exam was otherwise without abnormality on                            direct and retroflexion views. Complications:            No immediate complications. Estimated Blood Loss:     Estimated blood loss: none. Impression:               - Diverticulosis in the left colon.                           - Internal hemorrhoids.                           - The examination was otherwise normal on direct                            and retroflexion views.                           - No specimens collected. Recommendation:           - Patient has a contact number available for                            emergencies. The signs and symptoms of potential                            delayed complications were discussed with the                            patient. Return to normal activities tomorrow.                            Written discharge instructions were provided to the                            patient.                           - Resume previous diet.                           - Continue present medications.                           - Repeat colonoscopy in 10 years for screening                            purposes.Use miralax prep. Henry L. Loletha Carrow, MD 11/23/2015 2:37:51 PM This report has been signed electronically.

## 2015-11-24 ENCOUNTER — Telehealth: Payer: Self-pay | Admitting: *Deleted

## 2015-11-24 NOTE — Telephone Encounter (Signed)
  Follow up Call-  Call back number 11/23/2015  Post procedure Call Back phone  # [phone]  Permission to leave phone message Yes  Some recent data might be hidden     Patient questions:  Do you have a fever, pain , or abdominal swelling? No. Pain Score  0 *  Have you tolerated food without any problems? Yes.    Have you been able to return to your normal activities? Yes.    Do you have any questions about your discharge instructions: Diet   No. Medications  No. Follow up visit  No.  Do you have questions or concerns about your Care? No.  Actions: * If pain score is 4 or above: No action needed, pain <4.

## 2017-03-13 ENCOUNTER — Ambulatory Visit: Payer: BLUE CROSS/BLUE SHIELD | Admitting: Endocrinology

## 2017-06-21 ENCOUNTER — Ambulatory Visit (INDEPENDENT_AMBULATORY_CARE_PROVIDER_SITE_OTHER): Payer: Medicare Other | Admitting: Endocrinology

## 2017-06-21 ENCOUNTER — Encounter: Payer: Self-pay | Admitting: Endocrinology

## 2017-06-21 VITALS — BP 118/80 | HR 83 | Wt 132.0 lb

## 2017-06-21 DIAGNOSIS — E041 Nontoxic single thyroid nodule: Secondary | ICD-10-CM

## 2017-06-21 NOTE — Patient Instructions (Signed)
Let's recheck the ultrasound.  you will receive a phone call, about a day and time for an appointment.   If is is not much changed, Please come back for a follow-up appointment in 2 years.

## 2017-06-21 NOTE — Progress Notes (Signed)
Subjective:    Patient ID: Monique Lopez, female    DOB: October 22, 1950, 67 y.o.   MRN: [id number]  HPI Pt is referred by Dr Gerarda Fraction, for nodular thyroid.  Pt was noted to have a nodule at the thyroid in 2013, when I last saw her.  she has no h/o XRT or surgery to the neck.  She has slight swelling at the left ant neck, but no assoc pain  Past Medical History:  Diagnosis Date  . Allergy   . Anxiety   . Arthritis    hands, knees  . Emphysema of lung ([place])   . GERD (gastroesophageal reflux disease)    diet controlled  . HSV infection   . Hyperlipidemia   . Smoker    1 ppd plus x 40 plus years  . Thyroid disease    goiter- md is monitoring    Past Surgical History:  Procedure Laterality Date  . GALLBLADDER SURGERY  2001    Social History   Socioeconomic History  . Marital status: Widowed    Spouse name: Not on file  . Number of children: Not on file  . Years of education: Not on file  . Highest education level: Not on file  Occupational History  . Not on file  Social Needs  . Financial resource strain: Not on file  . Food insecurity:    Worry: Not on file    Inability: Not on file  . Transportation needs:    Medical: Not on file    Non-medical: Not on file  Tobacco Use  . Smoking status: Current Every Day Smoker    Packs/day: 1.00    Years: 40.00    Pack years: 40.00    Types: Cigarettes  . Smokeless tobacco: Never Used  Substance and Sexual Activity  . Alcohol use: Yes    Comment: occasional  . Drug use: No  . Sexual activity: Yes    Birth control/protection: Post-menopausal  Lifestyle  . Physical activity:    Days per week: Not on file    Minutes per session: Not on file  . Stress: Not on file  Relationships  . Social connections:    Talks on phone: Not on file    Gets together: Not on file    Attends religious service: Not on file    Active member of club or organization: Not on file    Attends meetings of clubs or organizations: Not on file   Relationship status: Not on file  . Intimate partner violence:    Fear of current or ex partner: Not on file    Emotionally abused: Not on file    Physically abused: Not on file    Forced sexual activity: Not on file  Other Topics Concern  . Not on file  Social History Narrative  . Not on file    Current Outpatient Medications on File Prior to Visit  Medication Sig Dispense Refill  . ALPRAZolam (XANAX) 0.25 MG tablet Take 0.25 mg by mouth 2 (two) times daily as needed for anxiety or sleep.     Marland Kitchen aspirin EC 81 MG tablet Take 1 tablet (81 mg total) by mouth daily. (Patient not taking: Reported on 06/21/2017)    . atorvastatin (LIPITOR) 40 MG tablet Take 1 tablet (40 mg total) by mouth daily. 30 tablet 0  . cetirizine (ZYRTEC) 10 MG tablet Take 10 mg by mouth daily as needed for allergies.    . chlorpheniramine-HYDROcodone (TUSSIONEX PENNKINETIC ER) 10-8 MG/5ML  SUER Take 5 mLs by mouth every 12 (twelve) hours as needed for cough.    . Hydrocodone-Chlorpheniramine 5-4 MG/5ML SOLN Take 5 mLs by mouth 2 (two) times daily.     Current Facility-Administered Medications on File Prior to Visit  Medication Dose Route Frequency Provider Last Rate Last Dose  . 0.9 %  sodium chloride infusion  500 mL Intravenous Continuous Doran Stabler, MD        Allergies  Allergen Reactions  . Amoxicillin Nausea And Vomiting    Diarrhea    Family History  Problem Relation Age of Onset  . Arthritis Mother   . Stroke Mother   . Esophageal cancer Maternal Uncle   . Colon cancer Neg Hx   . Rectal cancer Neg Hx   . Stomach cancer Neg Hx   . Thyroid disease Neg Hx     BP 118/80   Pulse 83   Wt 132 lb (59.9 kg)   SpO2 96%   BMI 21.97 kg/m     Review of Systems Denies weight change, hoarseness, diplopia, chest pain, sob, dysphagia, diarrhea, itching, flushing, easy bruising, depression, cold intolerance, headache, numbness, and rhinorrhea.  She has lost a few lbs.  She has anxiety and chronic  cough.       Objective:   Physical Exam VS: see vs page GEN: no distress HEAD: head: no deformity eyes: no periorbital swelling, no proptosis external nose and ears are normal mouth: no lesion seen NECK: 2 cm left thyroid nodule is easily palpable CHEST WALL: no deformity LUNGS: clear to auscultation CV: reg rate and rhythm, no murmur ABD: abdomen is soft, nontender.  no hepatosplenomegaly.  not distended.  no hernia MUSCULOSKELETAL: muscle bulk and strength are grossly normal.  no obvious joint swelling.  gait is normal and steady EXTEMITIES: no deformity.  no ulcer on the feet.  feet are of normal color and temp.  no edema PULSES: dorsalis pedis intact bilat.  no carotid bruit NEURO:  cn 2-12 grossly intact.   readily moves all 4's.  sensation is intact to touch on the feet SKIN:  Normal texture and temperature.  No rash or suspicious lesion is visible.   NODES:  None palpable at the neck PSYCH: alert, well-oriented.  Does not appear anxious nor depressed.  2013: 2 cm heterogeneous solid nodule in the left aspect of the isthmus.  6629: Bx: SCANT FOLLICULAR EPITHELIUM PRESENT, SEE COMMENT.  NO MALIGNANT CELLS IDENTIFIED.  outside test results are reviewed: TSH=2.0  I have reviewed outside records, and summarized: Pt was noted to have thyroid nodule, and referred here.  She has been seen in ER several times over the past few years, but not related to the thyroid      Assessment & Plan:  Thyroid nodule, due for recheck Cough: I told pt this is not related to the thyroid.    Patient Instructions  Let's recheck the ultrasound.  you will receive a phone call, about a day and time for an appointment.   If is is not much changed, Please come back for a follow-up appointment in 2 years.

## 2017-06-22 DIAGNOSIS — F419 Anxiety disorder, unspecified: Secondary | ICD-10-CM | POA: Diagnosis not present

## 2017-06-22 DIAGNOSIS — J329 Chronic sinusitis, unspecified: Secondary | ICD-10-CM | POA: Diagnosis not present

## 2017-06-22 DIAGNOSIS — Z1389 Encounter for screening for other disorder: Secondary | ICD-10-CM | POA: Diagnosis not present

## 2017-06-22 DIAGNOSIS — Z6820 Body mass index (BMI) 20.0-20.9, adult: Secondary | ICD-10-CM | POA: Diagnosis not present

## 2017-06-22 DIAGNOSIS — G4700 Insomnia, unspecified: Secondary | ICD-10-CM | POA: Diagnosis not present

## 2017-06-22 DIAGNOSIS — F329 Major depressive disorder, single episode, unspecified: Secondary | ICD-10-CM | POA: Diagnosis not present

## 2017-07-06 ENCOUNTER — Telehealth: Payer: Self-pay | Admitting: Endocrinology

## 2017-07-06 NOTE — Telephone Encounter (Signed)
Patient requesting imaging be done in [place] but somehow the Order ended up at [place] instead. Patient wants order sent to [facility]). Call patient when done.

## 2017-07-06 NOTE — Telephone Encounter (Signed)
Yes.  Anywhere is fine with me

## 2017-07-06 NOTE — Telephone Encounter (Signed)
Do I need to refer to [facility]?

## 2017-07-25 ENCOUNTER — Telehealth: Payer: Self-pay | Admitting: Endocrinology

## 2017-07-25 NOTE — Telephone Encounter (Signed)
Please forward to [facility] 

## 2017-07-25 NOTE — Telephone Encounter (Signed)
Patient stated she would like a referral for her ultrasound to the hospital in [facility] hospital. please advise

## 2017-08-14 ENCOUNTER — Telehealth: Payer: Self-pay | Admitting: Endocrinology

## 2017-08-14 NOTE — Telephone Encounter (Signed)
She can have done anywhere she wants.

## 2017-08-14 NOTE — Telephone Encounter (Signed)
Patient called [facility] & they were able to go ahead with scheduling patient.

## 2017-08-14 NOTE — Telephone Encounter (Signed)
Patient would like a call back to discuss ultrasound

## 2017-08-21 ENCOUNTER — Ambulatory Visit (HOSPITAL_COMMUNITY)
Admission: RE | Admit: 2017-08-21 | Discharge: 2017-08-21 | Disposition: A | Discharge status: Home / Self Care | Payer: Medicare Other | Source: Ambulatory Visit | Attending: Endocrinology | Admitting: Endocrinology

## 2017-08-21 DIAGNOSIS — E041 Nontoxic single thyroid nodule: Secondary | ICD-10-CM | POA: Diagnosis not present

## 2017-08-21 DIAGNOSIS — E042 Nontoxic multinodular goiter: Secondary | ICD-10-CM | POA: Diagnosis not present

## 2017-10-06 ENCOUNTER — Encounter: Payer: Self-pay | Admitting: Endocrinology

## 2017-10-06 ENCOUNTER — Other Ambulatory Visit (HOSPITAL_COMMUNITY)
Admission: RE | Admit: 2017-10-06 | Discharge: 2017-10-06 | Disposition: A | Discharge status: Home / Self Care | Payer: Medicare Other | Source: Ambulatory Visit | Attending: Endocrinology | Admitting: Endocrinology

## 2017-10-06 ENCOUNTER — Telehealth: Payer: Self-pay

## 2017-10-06 ENCOUNTER — Encounter

## 2017-10-06 ENCOUNTER — Ambulatory Visit (INDEPENDENT_AMBULATORY_CARE_PROVIDER_SITE_OTHER): Payer: Medicare Other | Admitting: Endocrinology

## 2017-10-06 VITALS — BP 158/86 | HR 67 | Ht 67.0 in | Wt 131.0 lb

## 2017-10-06 DIAGNOSIS — R896 Abnormal cytological findings in specimens from other organs, systems and tissues: Secondary | ICD-10-CM | POA: Diagnosis not present

## 2017-10-06 DIAGNOSIS — E041 Nontoxic single thyroid nodule: Secondary | ICD-10-CM

## 2017-10-06 NOTE — Patient Instructions (Addendum)
Your blood pressure is high today.  Please see your primary care provider soon, to have it rechecked We'll let you know about the biopsy results. If the biopsy result is good, please come back for a follow-up appointment in 6 months

## 2017-10-06 NOTE — Progress Notes (Signed)
   Subjective:    Patient ID: Monique Lopez, female    DOB: [birth year], 67 y.o.   MRN: [id number]  HPI Pt is here for thyroid bx.    Review of Systems     Objective:   Physical Exam thyroid needle bx: The nodule is palpated (just to the left of the isthmus) consent obtained, signed form on chart The area is first sprayed with cooling agent local: xylocaine 2%, with epinephrine prep: alcohol pad 4 bxs are done with 25 and 05L needles no complications       Assessment & Plan:  Thyroid nodule.  Lab has called.  Specimen was mishandled for staff, so pt will be called back to redo

## 2017-10-06 NOTE — Telephone Encounter (Signed)
Lft message for pt to return call to reschedule biopsy due to invalid collection of specimens.

## 2017-10-09 NOTE — Telephone Encounter (Signed)
You were not charged for last week. Please come back for a follow-up appointment in 2 weeks, to give the area a chance to heal first.

## 2017-10-09 NOTE — Telephone Encounter (Signed)
I have rescheduled patient for 11/08/17.

## 2017-10-09 NOTE — Telephone Encounter (Signed)
Does this patient need to have another order placed for [facility] or come in to see you?

## 2017-10-09 NOTE — Telephone Encounter (Signed)
Pt called back. She asked that you call her back once you know if another order needs to be put in and when she needs to schedule appt. Thanks.

## 2017-10-10 ENCOUNTER — Other Ambulatory Visit: Payer: Self-pay | Admitting: Endocrinology

## 2017-10-10 DIAGNOSIS — E041 Nontoxic single thyroid nodule: Secondary | ICD-10-CM

## 2017-10-18 ENCOUNTER — Ambulatory Visit: Payer: Medicare Other | Admitting: Endocrinology

## 2017-10-25 ENCOUNTER — Telehealth: Payer: Self-pay | Admitting: Endocrinology

## 2017-10-25 NOTE — Telephone Encounter (Signed)
Patient stated that Dr Loanne Drilling would be recommending a surgeon for her to see and she has not heard from anyone and would like to know the name and place  Please advise

## 2017-11-01 DIAGNOSIS — Z23 Encounter for immunization: Secondary | ICD-10-CM | POA: Diagnosis not present

## 2017-11-07 NOTE — Telephone Encounter (Signed)
Informed pt that referral have been behind and that someone will contact her soon with appt.

## 2017-11-08 ENCOUNTER — Ambulatory Visit: Payer: Medicare Other | Admitting: Endocrinology

## 2017-11-23 ENCOUNTER — Telehealth: Payer: Self-pay | Admitting: Endocrinology

## 2017-11-23 NOTE — Telephone Encounter (Signed)
Number to centralized scheduling given to pt

## 2017-11-23 NOTE — Telephone Encounter (Signed)
Please call patient for phone number to referral location of surgery. Thanks [facility] # [phone]

## 2017-12-06 ENCOUNTER — Ambulatory Visit: Payer: Self-pay | Admitting: Surgery

## 2017-12-06 DIAGNOSIS — D440 Neoplasm of uncertain behavior of thyroid gland: Secondary | ICD-10-CM | POA: Diagnosis not present

## 2018-01-01 DIAGNOSIS — K219 Gastro-esophageal reflux disease without esophagitis: Secondary | ICD-10-CM | POA: Diagnosis not present

## 2018-01-01 DIAGNOSIS — Z0001 Encounter for general adult medical examination with abnormal findings: Secondary | ICD-10-CM | POA: Diagnosis not present

## 2018-01-01 DIAGNOSIS — E785 Hyperlipidemia, unspecified: Secondary | ICD-10-CM | POA: Diagnosis not present

## 2018-01-01 DIAGNOSIS — F419 Anxiety disorder, unspecified: Secondary | ICD-10-CM | POA: Diagnosis not present

## 2018-01-01 DIAGNOSIS — J309 Allergic rhinitis, unspecified: Secondary | ICD-10-CM | POA: Diagnosis not present

## 2018-01-01 DIAGNOSIS — Z6820 Body mass index (BMI) 20.0-20.9, adult: Secondary | ICD-10-CM | POA: Diagnosis not present

## 2018-01-01 DIAGNOSIS — R946 Abnormal results of thyroid function studies: Secondary | ICD-10-CM | POA: Diagnosis not present

## 2018-01-01 DIAGNOSIS — Z1389 Encounter for screening for other disorder: Secondary | ICD-10-CM | POA: Diagnosis not present

## 2018-01-19 ENCOUNTER — Emergency Department (HOSPITAL_COMMUNITY): Payer: Medicare Other

## 2018-01-19 ENCOUNTER — Other Ambulatory Visit: Payer: Self-pay

## 2018-01-19 ENCOUNTER — Encounter (HOSPITAL_COMMUNITY): Payer: Self-pay | Admitting: Emergency Medicine

## 2018-01-19 ENCOUNTER — Emergency Department (HOSPITAL_COMMUNITY)
Admission: EM | Admit: 2018-01-19 | Discharge: 2018-01-19 | Disposition: A | Discharge status: Home / Self Care | Payer: Medicare Other | Attending: Emergency Medicine | Admitting: Emergency Medicine

## 2018-01-19 DIAGNOSIS — S5001XA Contusion of right elbow, initial encounter: Secondary | ICD-10-CM

## 2018-01-19 DIAGNOSIS — F17210 Nicotine dependence, cigarettes, uncomplicated: Secondary | ICD-10-CM | POA: Diagnosis not present

## 2018-01-19 DIAGNOSIS — M25521 Pain in right elbow: Secondary | ICD-10-CM | POA: Diagnosis not present

## 2018-01-19 DIAGNOSIS — W010XXA Fall on same level from slipping, tripping and stumbling without subsequent striking against object, initial encounter: Secondary | ICD-10-CM | POA: Insufficient documentation

## 2018-01-19 DIAGNOSIS — M25421 Effusion, right elbow: Secondary | ICD-10-CM | POA: Diagnosis not present

## 2018-01-19 DIAGNOSIS — Y939 Activity, unspecified: Secondary | ICD-10-CM | POA: Insufficient documentation

## 2018-01-19 DIAGNOSIS — W19XXXA Unspecified fall, initial encounter: Secondary | ICD-10-CM

## 2018-01-19 DIAGNOSIS — Y929 Unspecified place or not applicable: Secondary | ICD-10-CM | POA: Diagnosis not present

## 2018-01-19 DIAGNOSIS — Y999 Unspecified external cause status: Secondary | ICD-10-CM | POA: Diagnosis not present

## 2018-01-19 NOTE — ED Triage Notes (Signed)
Pt reports got bilateral cramps in legs and fell hurting elbow. Pt doesn't remember how she hurt it after trying "to walk her cramps out"

## 2018-01-19 NOTE — Discharge Instructions (Signed)
Xray showed no obvious fracture.  Sling, ice pack, Tylenol and/or ibuprofen for pain.  If pain does not improve, recommend follow-up with an orthopedic or bone specialist.  Phone number given.

## 2018-01-22 NOTE — ED Provider Notes (Signed)
[facility] EMERGENCY DEPARTMENT Provider Note   CSN: [id number] Arrival date & time: 01/19/18  1016     History   Chief Complaint Chief Complaint  Patient presents with  . Arm Pain    HPI Monique Lopez is a 67 y.o. female.  Patient complains of pain in the right elbow and proximal forearm after a fall.  She stated that cramping in her calves preceded the fall.  No head or neck trauma.  Severity of pain is moderate.  Palpation and position make pain worse.     Past Medical History:  Diagnosis Date  . Allergy   . Anxiety   . Arthritis    hands, knees  . Emphysema of lung ([place])   . GERD (gastroesophageal reflux disease)    diet controlled  . HSV infection   . Hyperlipidemia   . Smoker    1 ppd plus x 40 plus years  . Thyroid disease    goiter- md is monitoring    Patient Active Problem List   Diagnosis Date Noted  . Leukocytosis 03/18/2015  . Pain in the chest   . Nausea vomiting and diarrhea   . Elevated troponin 03/17/2015  . Demand ischemia ([place])   . Gastroenteritis, acute   . Allergy   . Hyperlipidemia   . Thyroid nodule 12/07/2011    Past Surgical History:  Procedure Laterality Date  . CHOLECYSTECTOMY    . GALLBLADDER SURGERY  2001     OB History   None      Home Medications    Prior to Admission medications   Medication Sig Start Date End Date Taking? Authorizing Provider  ALPRAZolam (XANAX) 0.25 MG tablet Take 0.25 mg by mouth 2 (two) times daily as needed for anxiety or sleep.    Yes [provider]  atorvastatin (LIPITOR) 40 MG tablet Take 1 tablet (40 mg total) by mouth daily. 03/18/15  Yes Short, Noah Delaine, MD  chlorpheniramine-HYDROcodone (TUSSIONEX PENNKINETIC ER) 10-8 MG/5ML SUER Take 5 mLs by mouth every 12 (twelve) hours as needed for cough.   Yes [provider]  azithromycin (ZITHROMAX) 250 MG tablet TAKE 2 TABLETS BY MOUTH TODAY, THEN TAKE 1 TABLET DAILY FOR 4 DAYS 01/03/18   [provider]    levofloxacin (LEVAQUIN) 500 MG tablet TAKE 1 TABLET BY MOUTH EVERY DAY FOR 10 DAYS 01/01/18   [provider]    Family History Family History  Problem Relation Age of Onset  . Arthritis Mother   . Stroke Mother   . Esophageal cancer Maternal Uncle   . Colon cancer Neg Hx   . Rectal cancer Neg Hx   . Stomach cancer Neg Hx   . Thyroid disease Neg Hx     Social History Social History   Tobacco Use  . Smoking status: Current Every Day Smoker    Packs/day: 1.00    Years: 40.00    Pack years: 40.00    Types: Cigarettes  . Smokeless tobacco: Never Used  Substance Use Topics  . Alcohol use: Yes    Comment: occasional  . Drug use: No     Allergies   Amoxicillin   Review of Systems Review of Systems  All other systems reviewed and are negative.    Physical Exam Updated Vital Signs BP (!) 155/81 (BP Location: Left Arm)   Pulse 82   Temp 97.8 F (36.6 C) (Oral)   Resp 18   Ht 5\' 7"  (1.702 m)   Wt 59 kg  SpO2 98%   BMI 20.36 kg/m   Physical Exam  Constitutional: She is oriented to person, place, and time. She appears well-developed and well-nourished.  HENT:  Head: Normocephalic and atraumatic.  Eyes: Conjunctivae are normal.  Neck: Neck supple.  Cardiovascular: Normal rate and regular rhythm.  Pulmonary/Chest: Effort normal and breath sounds normal.  Abdominal: Soft. Bowel sounds are normal.  Musculoskeletal:  Tender tip of the right elbow; pain with range of motion  Neurological: She is alert and oriented to person, place, and time.  Skin: Skin is warm and dry.  Psychiatric: She has a normal mood and affect. Her behavior is normal.  Nursing note and vitals reviewed.    ED Treatments / Results  Labs (all labs ordered are listed, but only abnormal results are displayed) Labs Reviewed - No data to display  EKG None  Radiology No results found.  Procedures Procedures (including critical care time)  Medications Ordered in  ED Medications - No data to display   Initial Impression / Assessment and Plan / ED Course  I have reviewed the triage vital signs and the nursing notes.  Pertinent labs & imaging results that were available during my care of the patient were reviewed by me and considered in my medical decision making (see chart for details).     Plain films of right elbow reveal a joint effusion.  No fracture noted.  Will sling and refer to orthopedics.  Final Clinical Impressions(s) / ED Diagnoses   Final diagnoses:  Fall, initial encounter  Contusion of right elbow, initial encounter    ED Discharge Orders    None       Nat Christen, MD 01/22/18 2008

## 2018-03-02 NOTE — Patient Instructions (Addendum)
Monique Lopez  03/02/2018   Your procedure is scheduled on: 03-08-18    Report to [facility] Main  Entrance    Report to Admitting at 9:00 AM    Call this number if you have problems the morning of surgery [phone]    Remember: Do not eat food or drink liquids :After Midnight. .     Take these medicines the morning of surgery with A SIP OF WATER: You may also bring and use your eyedrops as needed.               BRUSH YOUR TEETH MORNING OF SURGERY AND RINSE YOUR MOUTH OUT, NO CHEWING GUM CANDY OR MINTS                   You may not have any metal on your body including hair pins and              piercings  Do not wear jewelry, make-up, lotions, powders or perfumes, deodorant             Do not wear nail polish.  Do not shave  48 hours prior to surgery.     Do not bring valuables to the hospital. [place].  Contacts, dentures or bridgework may not be worn into surgery.  Leave suitcase in the car. After surgery it may be brought to your room.     Patients discharged the day of surgery will not be allowed to drive home. IF YOU ARE HAVING SURGERY AND GOING HOME THE SAME DAY, YOU MUST HAVE AN ADULT TO DRIVE YOU HOME AND BE WITH YOU FOR 24 HOURS. YOU MAY GO HOME BY TAXI OR UBER OR ORTHERWISE, BUT AN ADULT MUST ACCOMPANY YOU HOME AND STAY WITH YOU FOR 24 HOURS.    Special Instructions: N/A              Please read over the following fact sheets you were given: _____________________________________________________________________             [facility] - Preparing for Surgery Before surgery, you can play an important role.  Because skin is not sterile, your skin needs to be as free of germs as possible.  You can reduce the number of germs on your skin by washing with CHG (chlorahexidine gluconate) soap before surgery.  CHG is an antiseptic cleaner which kills germs and bonds with the skin to continue  killing germs even after washing. Please DO NOT use if you have an allergy to CHG or antibacterial soaps.  If your skin becomes reddened/irritated stop using the CHG and inform your nurse when you arrive at Short Stay. Do not shave (including legs and underarms) for at least 48 hours prior to the first CHG shower.  You may shave your face/neck. Please follow these instructions carefully:  1.  Shower with CHG Soap the night before surgery and the  morning of Surgery.  2.  If you choose to wash your hair, wash your hair first as usual with your  normal  shampoo.  3.  After you shampoo, rinse your hair and body thoroughly to remove the  shampoo.  4.  Use CHG as you would any other liquid soap.  You can apply chg directly  to the skin and wash                       Gently with a scrungie or clean washcloth.  5.  Apply the CHG Soap to your body ONLY FROM THE NECK DOWN.   Do not use on face/ open                           Wound or open sores. Avoid contact with eyes, ears mouth and genitals (private parts).                       Wash face,  Genitals (private parts) with your normal soap.             6.  Wash thoroughly, paying special attention to the area where your surgery  will be performed.  7.  Thoroughly rinse your body with warm water from the neck down.  8.  DO NOT shower/wash with your normal soap after using and rinsing off  the CHG Soap.                9.  Pat yourself dry with a clean towel.            10.  Wear clean pajamas.            11.  Place clean sheets on your bed the night of your first shower and do not  sleep with pets. Day of Surgery : Do not apply any lotions/deodorants the morning of surgery.  Please wear clean clothes to the hospital/surgery center.  FAILURE TO FOLLOW THESE INSTRUCTIONS MAY RESULT IN THE CANCELLATION OF YOUR SURGERY PATIENT SIGNATURE_________________________________  NURSE  SIGNATURE__________________________________  ________________________________________________________________________

## 2018-03-05 ENCOUNTER — Other Ambulatory Visit: Payer: Self-pay

## 2018-03-05 ENCOUNTER — Encounter (HOSPITAL_COMMUNITY): Payer: Self-pay

## 2018-03-05 ENCOUNTER — Encounter (HOSPITAL_COMMUNITY)
Admission: RE | Admit: 2018-03-05 | Discharge: 2018-03-05 | Disposition: A | Discharge status: Home / Self Care | Payer: Medicare Other | Source: Ambulatory Visit | Attending: Surgery | Admitting: Surgery

## 2018-03-05 DIAGNOSIS — K219 Gastro-esophageal reflux disease without esophagitis: Secondary | ICD-10-CM | POA: Diagnosis not present

## 2018-03-05 DIAGNOSIS — F419 Anxiety disorder, unspecified: Secondary | ICD-10-CM | POA: Diagnosis not present

## 2018-03-05 DIAGNOSIS — C73 Malignant neoplasm of thyroid gland: Secondary | ICD-10-CM | POA: Diagnosis not present

## 2018-03-05 DIAGNOSIS — Z01812 Encounter for preprocedural laboratory examination: Secondary | ICD-10-CM | POA: Insufficient documentation

## 2018-03-05 DIAGNOSIS — D489 Neoplasm of uncertain behavior, unspecified: Secondary | ICD-10-CM | POA: Diagnosis present

## 2018-03-05 DIAGNOSIS — Z886 Allergy status to analgesic agent status: Secondary | ICD-10-CM | POA: Diagnosis not present

## 2018-03-05 DIAGNOSIS — Z841 Family history of disorders of kidney and ureter: Secondary | ICD-10-CM | POA: Diagnosis not present

## 2018-03-05 DIAGNOSIS — F17200 Nicotine dependence, unspecified, uncomplicated: Secondary | ICD-10-CM | POA: Diagnosis not present

## 2018-03-05 DIAGNOSIS — Z79899 Other long term (current) drug therapy: Secondary | ICD-10-CM | POA: Diagnosis not present

## 2018-03-05 DIAGNOSIS — Z9049 Acquired absence of other specified parts of digestive tract: Secondary | ICD-10-CM | POA: Diagnosis not present

## 2018-03-05 DIAGNOSIS — Z809 Family history of malignant neoplasm, unspecified: Secondary | ICD-10-CM | POA: Diagnosis not present

## 2018-03-05 DIAGNOSIS — Z836 Family history of other diseases of the respiratory system: Secondary | ICD-10-CM | POA: Diagnosis not present

## 2018-03-05 DIAGNOSIS — M1990 Unspecified osteoarthritis, unspecified site: Secondary | ICD-10-CM | POA: Diagnosis not present

## 2018-03-05 DIAGNOSIS — Z880 Allergy status to penicillin: Secondary | ICD-10-CM | POA: Diagnosis not present

## 2018-03-05 HISTORY — DX: Other complications of anesthesia, initial encounter: T88.59XA

## 2018-03-05 HISTORY — DX: Adverse effect of unspecified anesthetic, initial encounter: T41.45XA

## 2018-03-05 HISTORY — DX: Other specified postprocedural states: Z98.890

## 2018-03-05 HISTORY — DX: Nausea with vomiting, unspecified: R11.2

## 2018-03-05 LAB — CBC
HEMATOCRIT: 43.6 % (ref 36.0–46.0)
HEMOGLOBIN: 13.8 g/dL (ref 12.0–15.0)
MCH: 29.5 pg (ref 26.0–34.0)
MCHC: 31.7 g/dL (ref 30.0–36.0)
MCV: 93.2 fL (ref 80.0–100.0)
Platelets: 219 10*3/uL (ref 150–400)
RBC: 4.68 MIL/uL (ref 3.87–5.11)
RDW: 13.1 % (ref 11.5–15.5)
WBC: 6.2 10*3/uL (ref 4.0–10.5)
nRBC: 0 % (ref 0.0–0.2)

## 2018-03-05 LAB — BASIC METABOLIC PANEL
Anion gap: 7 (ref 5–15)
BUN: 18 mg/dL (ref 8–23)
CO2: 30 mmol/L (ref 22–32)
Calcium: 9.7 mg/dL (ref 8.9–10.3)
Chloride: 107 mmol/L (ref 98–111)
Creatinine, Ser: 0.68 mg/dL (ref 0.44–1.00)
GFR calc Af Amer: >60 mL/min (ref >60)
GFR calc non Af Amer: >60 mL/min (ref >60)
GLUCOSE: 101 mg/dL — AB (ref 70–99)
Potassium: 4.8 mmol/L (ref 3.5–5.1)
Sodium: 144 mmol/L (ref 135–145)

## 2018-03-07 ENCOUNTER — Encounter (HOSPITAL_COMMUNITY): Payer: Self-pay | Admitting: Surgery

## 2018-03-07 DIAGNOSIS — D440 Neoplasm of uncertain behavior of thyroid gland: Secondary | ICD-10-CM | POA: Diagnosis present

## 2018-03-07 NOTE — H&P (View-Only) (Signed)
General Surgery [facility]  Monique Lopez DOB: 12/18/1950 Single / Language: Cleophus Molt / Race: White Female   History of Present Illness  The patient is a 68 year old female who presents with a thyroid nodule.  CHIEF COMPLAINT: thyroid neoplasm of uncertain behavior  Patient is referred by Dr. Renato Shin for surgical evaluation and management of a thyroid neoplasm of uncertain behavior. Patient was first diagnosed with a thyroid nodule approximately 7 years ago. This has been followed with sequential ultrasound scanning. Her most recent ultrasound was in July 2019. This shows a normal size thyroid gland which is mildly heterogeneous. There is a 2.7 cm nodule in the left side of the isthmus. It is solid. There are calcifications. It is hypoechoic. It was felt to be moderately suspicious and biopsy was recommended. Dr. Loanne Drilling performed fine-needle aspiration biopsy in the office. 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 Patient has had no prior head or neck surgery. She has never been on thyroid medication. There is no family history of thyroid neoplasm or thyroid cancer. There has been interval enlargement of the nodule compared to prior ultrasound examination. Therefore the patient is referred for surgical resection for definitive diagnosis and management.   Past Surgical History  Gallbladder Surgery - Laparoscopic   Diagnostic Studies History  Mammogram  1-3 years ago  Allergies  Motrin *ANALGESICS - ANTI-INFLAMMATORY*  Penicillins  Allergies Reconciled   Medication History ALPRAZolam (0.25MG  Tablet, Oral) Active. Atorvastatin Calcium (40MG  Tablet, Oral) Active. ZyrTEC Allergy (10MG  Tablet, Oral) Active. Medications Reconciled  Social History Alcohol use  Occasional alcohol use. Caffeine use  Carbonated beverages. No drug use  Tobacco use  Current every day smoker.  Family  History Cancer  Mother. Kidney Disease  Daughter. Respiratory Condition  Mother.  Other Problems Hypercholesterolemia   Vitals Weight: 130 lb Height: 67in Body Surface Area: 1.68 m Body Mass Index: 20.36 kg/m  Pain Level: 0/10 Temp.: 96.65F(Temporal)  Pulse: 71 (Regular)  P.OX: 98% (Room air) BP: 124/72 (Sitting, Left Arm, Standard)  Physical Exam   See vital signs recorded above  GENERAL APPEARANCE Development: normal Nutritional status: normal Gross deformities: none  SKIN Rash, lesions, ulcers: none Induration, erythema: none Nodules: none palpable  EYES Conjunctiva and lids: normal Pupils: equal and reactive Iris: normal bilaterally  EARS, NOSE, MOUTH, THROAT External ears: no lesion or deformity External nose: no lesion or deformity Hearing: grossly normal Lips: no lesion or deformity Dentition: normal for age Oral mucosa: moist  NECK Symmetric: no Trachea: midline Thyroid: Located anteriorly and to the left of midline is a 3 cm firm discrete mobile nontender nodule in the thyroid. There are no other dominant or discrete nodules in either the left or the right thyroid lobe. There is no associated lymphadenopathy.  CHEST Respiratory effort: normal Retraction or accessory muscle use: no Breath sounds: normal bilaterally Rales, rhonchi, wheeze: none  CARDIOVASCULAR Auscultation: regular rhythm, normal rate Murmurs: none Pulses: carotid and radial pulse 2+ palpable Lower extremity edema: none Lower extremity varicosities: none  MUSCULOSKELETAL Station and gait: normal Digits and nails: no clubbing or cyanosis Muscle strength: grossly normal all extremities Range of motion: grossly normal all extremities Deformity: none  LYMPHATIC Cervical: none palpable Supraclavicular: none palpable  PSYCHIATRIC Oriented to person, place, and time: yes Mood and affect: normal for situation Judgment and insight: appropriate for  situation    Assessment & Plan   NEOPLASM OF UNCERTAIN BEHAVIOR OF THYROID GLAND (D44.0)  Pt  Education - Pamphlet Given - The Thyroid Book: discussed with patient and provided information. Patient presents on referral from her endocrinologist for evaluation of an enlarging thyroid nodule with cytologic atypia. She is provided with written literature on thyroid surgery to review at home.  I have recommended proceeding with left thyroid lobectomy for complete removal of the dominant nodule in the left side of the isthmus. There is approximately a 25% chance that this is malignant and approximately a 75% chance that this is benign. We discussed the surgical procedure including the risk of surgery. This includes risk of injury to recurrent laryngeal nerves and risk of injury to parathyroid glands. We discussed the fact that if this was cancer she might require a second operation for completion thyroidectomy. We discussed the hospital stay. We discussed the potential need for lifelong thyroid hormone replacement. We discussed the potential need for radioactive iodine treatment. Patient understands and would like to proceed with surgery in the near future. 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 We will try to coordinate her surgery at a time that is convenient for the patient and her family.  The risks and benefits of the procedure have been discussed at length with the patient. The patient understands the proposed procedure, potential alternative treatments, and the course of recovery to be expected. All of the patient's questions have been answered at this time. The patient wishes to proceed with surgery.   Armandina Gemma, [place] [facility] Office: [phone]

## 2018-03-07 NOTE — H&P (Signed)
General Surgery [facility]  LATAYSHA VOHRA DOB: 1950/11/14 Single / Language: Cleophus Molt / Race: White Female   History of Present Illness  The patient is a 68 year old female who presents with a thyroid nodule.  CHIEF COMPLAINT: thyroid neoplasm of uncertain behavior  Patient is referred by Dr. Renato Shin for surgical evaluation and management of a thyroid neoplasm of uncertain behavior. Patient was first diagnosed with a thyroid nodule approximately 7 years ago. This has been followed with sequential ultrasound scanning. Her most recent ultrasound was in July 2019. This shows a normal size thyroid gland which is mildly heterogeneous. There is a 2.7 cm nodule in the left side of the isthmus. It is solid. There are calcifications. It is hypoechoic. It was felt to be moderately suspicious and biopsy was recommended. Dr. Loanne Drilling performed fine-needle aspiration biopsy in the office. 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 Patient has had no prior head or neck surgery. She has never been on thyroid medication. There is no family history of thyroid neoplasm or thyroid cancer. There has been interval enlargement of the nodule compared to prior ultrasound examination. Therefore the patient is referred for surgical resection for definitive diagnosis and management.   Past Surgical History  Gallbladder Surgery - Laparoscopic   Diagnostic Studies History  Mammogram  1-3 years ago  Allergies  Motrin *ANALGESICS - ANTI-INFLAMMATORY*  Penicillins  Allergies Reconciled   Medication History ALPRAZolam (0.25MG  Tablet, Oral) Active. Atorvastatin Calcium (40MG  Tablet, Oral) Active. ZyrTEC Allergy (10MG  Tablet, Oral) Active. Medications Reconciled  Social History Alcohol use  Occasional alcohol use. Caffeine use  Carbonated beverages. No drug use  Tobacco use  Current every day smoker.  Family  History Cancer  Mother. Kidney Disease  Daughter. Respiratory Condition  Mother.  Other Problems Hypercholesterolemia   Vitals Weight: 130 lb Height: 67in Body Surface Area: 1.68 m Body Mass Index: 20.36 kg/m  Pain Level: 0/10 Temp.: 96.47F(Temporal)  Pulse: 71 (Regular)  P.OX: 98% (Room air) BP: 124/72 (Sitting, Left Arm, Standard)  Physical Exam   See vital signs recorded above  GENERAL APPEARANCE Development: normal Nutritional status: normal Gross deformities: none  SKIN Rash, lesions, ulcers: none Induration, erythema: none Nodules: none palpable  EYES Conjunctiva and lids: normal Pupils: equal and reactive Iris: normal bilaterally  EARS, NOSE, MOUTH, THROAT External ears: no lesion or deformity External nose: no lesion or deformity Hearing: grossly normal Lips: no lesion or deformity Dentition: normal for age Oral mucosa: moist  NECK Symmetric: no Trachea: midline Thyroid: Located anteriorly and to the left of midline is a 3 cm firm discrete mobile nontender nodule in the thyroid. There are no other dominant or discrete nodules in either the left or the right thyroid lobe. There is no associated lymphadenopathy.  CHEST Respiratory effort: normal Retraction or accessory muscle use: no Breath sounds: normal bilaterally Rales, rhonchi, wheeze: none  CARDIOVASCULAR Auscultation: regular rhythm, normal rate Murmurs: none Pulses: carotid and radial pulse 2+ palpable Lower extremity edema: none Lower extremity varicosities: none  MUSCULOSKELETAL Station and gait: normal Digits and nails: no clubbing or cyanosis Muscle strength: grossly normal all extremities Range of motion: grossly normal all extremities Deformity: none  LYMPHATIC Cervical: none palpable Supraclavicular: none palpable  PSYCHIATRIC Oriented to person, place, and time: yes Mood and affect: normal for situation Judgment and insight: appropriate for  situation    Assessment & Plan   NEOPLASM OF UNCERTAIN BEHAVIOR OF THYROID GLAND (D44.0)  Pt  Education - Pamphlet Given - The Thyroid Book: discussed with patient and provided information. Patient presents on referral from her endocrinologist for evaluation of an enlarging thyroid nodule with cytologic atypia. She is provided with written literature on thyroid surgery to review at home.  I have recommended proceeding with left thyroid lobectomy for complete removal of the dominant nodule in the left side of the isthmus. There is approximately a 25% chance that this is malignant and approximately a 75% chance that this is benign. We discussed the surgical procedure including the risk of surgery. This includes risk of injury to recurrent laryngeal nerves and risk of injury to parathyroid glands. We discussed the fact that if this was cancer she might require a second operation for completion thyroidectomy. We discussed the hospital stay. We discussed the potential need for lifelong thyroid hormone replacement. We discussed the potential need for radioactive iodine treatment. Patient understands and would like to proceed with surgery in the near future. 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 We will try to coordinate her surgery at a time that is convenient for the patient and her family.  The risks and benefits of the procedure have been discussed at length with the patient. The patient understands the proposed procedure, potential alternative treatments, and the course of recovery to be expected. All of the patient's questions have been answered at this time. The patient wishes to proceed with surgery.   Armandina Gemma, [place] [facility] Office: [phone]

## 2018-03-08 ENCOUNTER — Other Ambulatory Visit: Payer: Self-pay

## 2018-03-08 ENCOUNTER — Encounter (HOSPITAL_COMMUNITY): Payer: Self-pay | Admitting: Registered Nurse

## 2018-03-08 ENCOUNTER — Encounter (HOSPITAL_COMMUNITY)
Admission: RE | Disposition: A | Discharge status: Home / Self Care | Payer: Self-pay | Source: Ambulatory Visit | Attending: Surgery

## 2018-03-08 ENCOUNTER — Ambulatory Visit (HOSPITAL_COMMUNITY): Payer: Medicare Other | Admitting: Physician Assistant

## 2018-03-08 ENCOUNTER — Ambulatory Visit (HOSPITAL_COMMUNITY): Payer: Medicare Other | Admitting: Registered Nurse

## 2018-03-08 ENCOUNTER — Observation Stay (HOSPITAL_COMMUNITY)
Admission: RE | Admit: 2018-03-08 | Discharge: 2018-03-09 | Disposition: A | Discharge status: Home / Self Care | Payer: Medicare Other | Source: Ambulatory Visit | Attending: Surgery | Admitting: Surgery

## 2018-03-08 DIAGNOSIS — C73 Malignant neoplasm of thyroid gland: Secondary | ICD-10-CM | POA: Diagnosis not present

## 2018-03-08 DIAGNOSIS — K219 Gastro-esophageal reflux disease without esophagitis: Secondary | ICD-10-CM | POA: Insufficient documentation

## 2018-03-08 DIAGNOSIS — Z809 Family history of malignant neoplasm, unspecified: Secondary | ICD-10-CM | POA: Insufficient documentation

## 2018-03-08 DIAGNOSIS — Z9049 Acquired absence of other specified parts of digestive tract: Secondary | ICD-10-CM | POA: Insufficient documentation

## 2018-03-08 DIAGNOSIS — Z836 Family history of other diseases of the respiratory system: Secondary | ICD-10-CM | POA: Insufficient documentation

## 2018-03-08 DIAGNOSIS — F419 Anxiety disorder, unspecified: Secondary | ICD-10-CM | POA: Insufficient documentation

## 2018-03-08 DIAGNOSIS — D440 Neoplasm of uncertain behavior of thyroid gland: Secondary | ICD-10-CM | POA: Diagnosis not present

## 2018-03-08 DIAGNOSIS — Z886 Allergy status to analgesic agent status: Secondary | ICD-10-CM | POA: Insufficient documentation

## 2018-03-08 DIAGNOSIS — Z841 Family history of disorders of kidney and ureter: Secondary | ICD-10-CM | POA: Insufficient documentation

## 2018-03-08 DIAGNOSIS — Z880 Allergy status to penicillin: Secondary | ICD-10-CM | POA: Insufficient documentation

## 2018-03-08 DIAGNOSIS — Z79899 Other long term (current) drug therapy: Secondary | ICD-10-CM | POA: Insufficient documentation

## 2018-03-08 DIAGNOSIS — M1990 Unspecified osteoarthritis, unspecified site: Secondary | ICD-10-CM | POA: Diagnosis not present

## 2018-03-08 DIAGNOSIS — F17200 Nicotine dependence, unspecified, uncomplicated: Secondary | ICD-10-CM | POA: Diagnosis not present

## 2018-03-08 HISTORY — PX: THYROID LOBECTOMY: SHX420

## 2018-03-08 SURGERY — LOBECTOMY, THYROID
Anesthesia: General | Laterality: Left

## 2018-03-08 MED ORDER — DEXAMETHASONE SODIUM PHOSPHATE 10 MG/ML IJ SOLN
INTRAMUSCULAR | Status: AC
  Filled 2018-03-08: qty 1

## 2018-03-08 MED ORDER — ROCURONIUM BROMIDE 10 MG/ML (PF) SYRINGE
PREFILLED_SYRINGE | INTRAVENOUS | Status: DC | PRN
  Administered 2018-03-08: 40 mg via INTRAVENOUS

## 2018-03-08 MED ORDER — TRAMADOL HCL 50 MG PO TABS: 50.0000 mg | ORAL_TABLET | Freq: Four times a day (QID) | ORAL | Status: DC | PRN

## 2018-03-08 MED ORDER — LIDOCAINE 2% (20 MG/ML) 5 ML SYRINGE
INTRAMUSCULAR | Status: AC
  Filled 2018-03-08: qty 5

## 2018-03-08 MED ORDER — ONDANSETRON HCL 4 MG/2ML IJ SOLN: 4.0000 mg | Freq: Four times a day (QID) | INTRAMUSCULAR | Status: DC | PRN

## 2018-03-08 MED ORDER — FENTANYL CITRATE (PF) 250 MCG/5ML IJ SOLN
INTRAMUSCULAR | Status: DC | PRN
  Administered 2018-03-08: 50 ug via INTRAVENOUS
  Administered 2018-03-08: 100 ug via INTRAVENOUS
  Administered 2018-03-08: 50 ug via INTRAVENOUS

## 2018-03-08 MED ORDER — ONDANSETRON HCL 4 MG/2ML IJ SOLN
INTRAMUSCULAR | Status: AC
  Filled 2018-03-08: qty 2

## 2018-03-08 MED ORDER — ALPRAZOLAM 0.25 MG PO TABS: 0.1250 mg | ORAL_TABLET | Freq: Every day | ORAL | Status: DC

## 2018-03-08 MED ORDER — FENTANYL CITRATE (PF) 100 MCG/2ML IJ SOLN
INTRAMUSCULAR | Status: AC
  Filled 2018-03-08: qty 2

## 2018-03-08 MED ORDER — DEXAMETHASONE SODIUM PHOSPHATE 10 MG/ML IJ SOLN
INTRAMUSCULAR | Status: DC | PRN
  Administered 2018-03-08: 5 mg via INTRAVENOUS

## 2018-03-08 MED ORDER — OXYCODONE HCL 5 MG PO TABS: 5.0000 mg | ORAL_TABLET | Freq: Once | ORAL | Status: DC | PRN

## 2018-03-08 MED ORDER — OXYCODONE HCL 5 MG/5ML PO SOLN
5.0000 mg | Freq: Once | ORAL | Status: DC | PRN
  Filled 2018-03-08: qty 5

## 2018-03-08 MED ORDER — LIDOCAINE 2% (20 MG/ML) 5 ML SYRINGE
INTRAMUSCULAR | Status: DC | PRN
  Administered 2018-03-08: 40 mg via INTRAVENOUS

## 2018-03-08 MED ORDER — FENTANYL CITRATE (PF) 250 MCG/5ML IJ SOLN
INTRAMUSCULAR | Status: AC
  Filled 2018-03-08: qty 5

## 2018-03-08 MED ORDER — ACETAMINOPHEN 10 MG/ML IV SOLN
INTRAVENOUS | Status: AC
  Filled 2018-03-08: qty 100

## 2018-03-08 MED ORDER — ACETAMINOPHEN 10 MG/ML IV SOLN: 1000.0000 mg | Freq: Once | INTRAVENOUS | Status: DC | PRN

## 2018-03-08 MED ORDER — PROPOFOL 500 MG/50ML IV EMUL: INTRAVENOUS | Status: DC | PRN

## 2018-03-08 MED ORDER — HYDROMORPHONE HCL 1 MG/ML IJ SOLN: 1.0000 mg | INTRAMUSCULAR | Status: DC | PRN

## 2018-03-08 MED ORDER — PROPOFOL 10 MG/ML IV BOLUS
INTRAVENOUS | Status: AC
  Filled 2018-03-08: qty 60

## 2018-03-08 MED ORDER — CHLORHEXIDINE GLUCONATE CLOTH 2 % EX PADS: 6.0000 | MEDICATED_PAD | Freq: Once | CUTANEOUS | Status: DC

## 2018-03-08 MED ORDER — HYDROCODONE-ACETAMINOPHEN 5-325 MG PO TABS
1.0000 | ORAL_TABLET | ORAL | Status: DC | PRN
  Administered 2018-03-08 – 2018-03-09 (×2): 1 via ORAL
  Filled 2018-03-08 (×2): qty 1

## 2018-03-08 MED ORDER — ACETAMINOPHEN 325 MG PO TABS: 650.0000 mg | ORAL_TABLET | Freq: Four times a day (QID) | ORAL | Status: DC | PRN

## 2018-03-08 MED ORDER — LACTATED RINGERS IV SOLN
INTRAVENOUS | Status: DC
  Administered 2018-03-08: 09:00:00 via INTRAVENOUS

## 2018-03-08 MED ORDER — PROPOFOL 10 MG/ML IV BOLUS
INTRAVENOUS | Status: AC
  Filled 2018-03-08: qty 20

## 2018-03-08 MED ORDER — MIDAZOLAM HCL 2 MG/2ML IJ SOLN
INTRAMUSCULAR | Status: AC
  Filled 2018-03-08: qty 2

## 2018-03-08 MED ORDER — FENTANYL CITRATE (PF) 100 MCG/2ML IJ SOLN
25.0000 ug | INTRAMUSCULAR | Status: DC | PRN
  Administered 2018-03-08 (×2): 25 ug via INTRAVENOUS

## 2018-03-08 MED ORDER — ACETAMINOPHEN 650 MG RE SUPP: 650.0000 mg | Freq: Four times a day (QID) | RECTAL | Status: DC | PRN

## 2018-03-08 MED ORDER — CIPROFLOXACIN IN D5W 400 MG/200ML IV SOLN
400.0000 mg | INTRAVENOUS | Status: AC
  Administered 2018-03-08: 400 mg via INTRAVENOUS
  Filled 2018-03-08: qty 200

## 2018-03-08 MED ORDER — KETOROLAC TROMETHAMINE 30 MG/ML IJ SOLN
INTRAMUSCULAR | Status: AC
  Filled 2018-03-08: qty 1

## 2018-03-08 MED ORDER — KCL IN DEXTROSE-NACL 20-5-0.45 MEQ/L-%-% IV SOLN
INTRAVENOUS | Status: DC
  Administered 2018-03-08 – 2018-03-09 (×2): via INTRAVENOUS
  Filled 2018-03-08 (×3): qty 1000

## 2018-03-08 MED ORDER — PROPOFOL 10 MG/ML IV BOLUS
INTRAVENOUS | Status: DC | PRN
  Administered 2018-03-08: 100 mg via INTRAVENOUS
  Administered 2018-03-08: 10 mg via INTRAVENOUS

## 2018-03-08 MED ORDER — PROPOFOL 500 MG/50ML IV EMUL
INTRAVENOUS | Status: DC | PRN
  Administered 2018-03-08: 125 ug/kg/min via INTRAVENOUS

## 2018-03-08 MED ORDER — ONDANSETRON HCL 4 MG/2ML IJ SOLN
INTRAMUSCULAR | Status: DC | PRN
  Administered 2018-03-08: 4 mg via INTRAVENOUS

## 2018-03-08 MED ORDER — ACETAMINOPHEN 500 MG PO TABS: 1000.0000 mg | ORAL_TABLET | Freq: Once | ORAL | Status: DC | PRN

## 2018-03-08 MED ORDER — SUGAMMADEX SODIUM 200 MG/2ML IV SOLN
INTRAVENOUS | Status: DC | PRN
  Administered 2018-03-08: 200 mg via INTRAVENOUS

## 2018-03-08 MED ORDER — ACETAMINOPHEN 10 MG/ML IV SOLN
INTRAVENOUS | Status: DC | PRN
  Administered 2018-03-08: 1000 mg via INTRAVENOUS

## 2018-03-08 MED ORDER — ACETAMINOPHEN 160 MG/5ML PO SOLN: 1000.0000 mg | Freq: Once | ORAL | Status: DC | PRN

## 2018-03-08 MED ORDER — ONDANSETRON 4 MG PO TBDP: 4.0000 mg | ORAL_TABLET | Freq: Four times a day (QID) | ORAL | Status: DC | PRN

## 2018-03-08 MED ORDER — ROCURONIUM BROMIDE 10 MG/ML (PF) SYRINGE
PREFILLED_SYRINGE | INTRAVENOUS | Status: AC
  Filled 2018-03-08: qty 10

## 2018-03-08 MED ORDER — SUGAMMADEX SODIUM 200 MG/2ML IV SOLN
INTRAVENOUS | Status: AC
  Filled 2018-03-08: qty 2

## 2018-03-08 MED ORDER — MIDAZOLAM HCL 5 MG/5ML IJ SOLN
INTRAMUSCULAR | Status: DC | PRN
  Administered 2018-03-08: 2 mg via INTRAVENOUS

## 2018-03-08 SURGICAL SUPPLY — 28 items
ADH SKN CLS APL DERMABOND .7 (GAUZE/BANDAGES/DRESSINGS)
ATTRACTOMAT 16X20 MAGNETIC DRP (DRAPES) ×3 IMPLANT
BLADE SURG 15 STRL LF DISP TIS (BLADE) ×1 IMPLANT
BLADE SURG 15 STRL SS (BLADE) ×3
CHLORAPREP W/TINT 26ML (MISCELLANEOUS) ×6 IMPLANT
CLIP VESOCCLUDE MED 6/CT (CLIP) ×6 IMPLANT
CLIP VESOCCLUDE SM WIDE 6/CT (CLIP) ×6 IMPLANT
COVER SURGICAL LIGHT HANDLE (MISCELLANEOUS) ×3 IMPLANT
COVER WAND RF STERILE (DRAPES) ×3 IMPLANT
DERMABOND ADVANCED (GAUZE/BANDAGES/DRESSINGS)
DERMABOND ADVANCED .7 DNX12 (GAUZE/BANDAGES/DRESSINGS) IMPLANT
DRAPE LAPAROTOMY T 98X78 PEDS (DRAPES) ×3 IMPLANT
ELECT PENCIL ROCKER SW 15FT (MISCELLANEOUS) ×3 IMPLANT
ELECT REM PT RETURN 15FT ADLT (MISCELLANEOUS) ×3 IMPLANT
GAUZE 4X4 16PLY RFD (DISPOSABLE) ×3 IMPLANT
GLOVE SURG ORTHO 8.0 STRL STRW (GLOVE) ×3 IMPLANT
GOWN STRL REUS W/TWL XL LVL3 (GOWN DISPOSABLE) ×6 IMPLANT
HEMOSTAT SURGICEL 2X4 FIBR (HEMOSTASIS) IMPLANT
ILLUMINATOR WAVEGUIDE N/F (MISCELLANEOUS) ×2 IMPLANT
KIT BASIN OR (CUSTOM PROCEDURE TRAY) ×3 IMPLANT
PACK BASIC VI WITH GOWN DISP (CUSTOM PROCEDURE TRAY) ×3 IMPLANT
SHEARS HARMONIC 9CM CVD (BLADE) ×3 IMPLANT
SUT MNCRL AB 4-0 PS2 18 (SUTURE) ×3 IMPLANT
SUT VIC AB 3-0 SH 18 (SUTURE) ×6 IMPLANT
SYR BULB IRRIGATION 50ML (SYRINGE) ×3 IMPLANT
TOWEL OR 17X26 10 PK STRL BLUE (TOWEL DISPOSABLE) ×3 IMPLANT
TOWEL OR NON WOVEN STRL DISP B (DISPOSABLE) ×3 IMPLANT
YANKAUER SUCT BULB TIP 10FT TU (MISCELLANEOUS) ×3 IMPLANT

## 2018-03-08 NOTE — Plan of Care (Signed)

## 2018-03-08 NOTE — Anesthesia Procedure Notes (Signed)
Procedure Name: Intubation Date/Time: 03/08/2018 10:55 AM Performed by: Talbot Grumbling, CRNA Pre-anesthesia Checklist: Patient identified, Emergency Drugs available, Patient being monitored and Suction available Patient Re-evaluated:Patient Re-evaluated prior to induction Oxygen Delivery Method: Circle system utilized Preoxygenation: Pre-oxygenation with 100% oxygen Induction Type: IV induction Ventilation: Mask ventilation without difficulty Laryngoscope Size: Mac and 3 Grade View: Grade I Tube type: Oral Tube size: 7.0 mm Number of attempts: 1 Airway Equipment and Method: Stylet Placement Confirmation: ETT inserted through vocal cords under direct vision,  positive ETCO2 and breath sounds checked- equal and bilateral Secured at: 21 cm Tube secured with: Tape Dental Injury: Teeth and Oropharynx as per pre-operative assessment

## 2018-03-08 NOTE — Op Note (Signed)
Procedure Note  Pre-operative Diagnosis:  thyroid neoplasm of uncertain behavior, Bethesda category IV  Post-operative Diagnosis:  same  Surgeon:  Armandina Gemma, MD  Assistant:  none   Procedure:  Left thyroid lobectomy and isthmusectomy  Anesthesia:  General  Estimated Blood Loss:  minimal  Drains: none         Specimen: thyroid lobe to pathology  Indications:  Patient is referred by Dr. Renato Shin for surgical evaluation and management of a thyroid neoplasm of uncertain behavior. Patient was first diagnosed with a thyroid nodule approximately 7 years ago. This has been followed with sequential ultrasound scanning. Her most recent ultrasound was in July 2019. This shows a normal size thyroid gland which is mildly heterogeneous. There is a 2.7 cm nodule in the left side of the isthmus. It is solid. There are calcifications. It is hypoechoic. It was felt to be moderately suspicious and biopsy was recommended. Dr. Loanne Drilling performed fine-needle aspiration biopsy in the office. 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 Patient has had no prior head or neck surgery. She has never been on thyroid medication. There is no family history of thyroid neoplasm or thyroid cancer. There has been interval enlargement of the nodule compared to prior ultrasound examination. Therefore the patient is referred for surgical resection for definitive diagnosis and management.  Procedure Details: Procedure was done in OR #5 at the [facility]. The patient was brought to the operating room and placed in a supine position on the operating room table. Following administration of general anesthesia, the patient was positioned and then prepped and draped in the usual aseptic fashion. After ascertaining that an adequate level of anesthesia had been achieved, a small Kocher incision was made with #15 blade. Dissection was carried through subcutaneous  tissues and platysma. Hemostasis was achieved with the electrocautery. Skin flaps were elevated cephalad and caudad from the thyroid notch to the sternal notch. A self-retaining retractor was placed for exposure. Strap muscles were incised in the midline and dissection was begun on the left side. Strap muscles were reflected laterally. The left thyroid lobe was normal in size with a dominant mass located medially and extending into the left side of the isthmus. The lobe was gently mobilized with blunt dissection. Superior pole vessels were dissected out and divided individually between small and medium ligaclips with the harmonic scalpel. The thyroid lobe was rolled anteriorly. Branches of the inferior thyroid artery were divided between small ligaclips with the harmonic scalpel. Inferior venous tributaries were divided between ligaclips. Both the superior and inferior parathyroid glands were identified and preserved on their vascular pedicles. The recurrent laryngeal nerve was identified and preserved along its course. The ligament of Gwenlyn Found was released with the electrocautery and the gland was mobilized onto the anterior trachea. Isthmus was mobilized across the midline. There was a tiny pyramidal lobe present which was resected with the isthmus. The thyroid parenchyma was transected at the junction of the isthmus and contralateral thyroid lobe with the harmonic scalpel. The thyroid lobe and isthmus were submitted to pathology for review.  The neck was irrigated with warm saline. Fibrillar was placed throughout the operative field. Strap muscles were approximated in the midline with interrupted 3-0 Vicryl sutures. Platysma was closed with interrupted 3-0 Vicryl sutures. Skin was closed with a running 4-0 Monocryl subcuticular suture.  Wound was washed and dried and Dermabond was applied. The patient was awakened from anesthesia and brought to the recovery room. The patient tolerated  the procedure well.   Armandina Gemma, MD [facility] Office: [phone]

## 2018-03-08 NOTE — Transfer of Care (Signed)
Immediate Anesthesia Transfer of Care Note  Patient: Monique Lopez  Procedure(s) Performed: LEFT THYROID LOBECTOMY (Left )  Patient Location: PACU  Anesthesia Type:General  Level of Consciousness: sedated  Airway & Oxygen Therapy: Patient Spontanous Breathing and Patient connected to face mask oxygen  Post-op Assessment: Report given to RN and Post -op Vital signs reviewed and stable  Post vital signs: Reviewed and stable  Last Vitals:  Vitals Value Taken Time  BP 147/79 03/08/2018 12:01 PM  Temp    Pulse 68 03/08/2018 12:01 PM  Resp 14 03/08/2018 12:01 PM  SpO2 100 % 03/08/2018 12:01 PM  Vitals shown include unvalidated device data.  Last Pain:  Vitals:   03/08/18 0912  TempSrc:   PainSc: 0-No pain      Patients Stated Pain Goal: 4 (50/[id number])  Complications: No apparent anesthesia complications

## 2018-03-08 NOTE — Anesthesia Preprocedure Evaluation (Addendum)
Anesthesia Evaluation    History of Anesthesia Complications (+) PONV and history of anesthetic complications  Airway Mallampati: II  TM Distance: >3 FB Neck ROM: Full    Dental  (+) Edentulous Upper, Missing,    Pulmonary neg shortness of breath, neg sleep apnea, COPD, neg recent URI, Current Smoker,    breath sounds clear to auscultation       Cardiovascular negative cardio ROS   Rhythm:Regular     Neuro/Psych Anxiety negative neurological ROS     GI/Hepatic Neg liver ROS, GERD  Controlled,  Endo/Other  negative endocrine ROSgoiter  Renal/GU negative Renal ROS     Musculoskeletal  (+) Arthritis ,   Abdominal   Peds  Hematology negative hematology ROS (+)   Anesthesia Other Findings   Reproductive/Obstetrics                             Anesthesia Physical Anesthesia Plan  ASA: II  Anesthesia Plan: General   Post-op Pain Management:    Induction: Intravenous  PONV Risk Score and Plan: 3 and Ondansetron, Dexamethasone, Propofol infusion and TIVA  Airway Management Planned: Oral ETT  Additional Equipment: None  Intra-op Plan:   Post-operative Plan: Extubation in OR  Informed Consent: I have reviewed the patients History and Physical, chart, labs and discussed the procedure including the risks, benefits and alternatives for the proposed anesthesia with the patient or authorized representative who has indicated his/her understanding and acceptance.     Dental advisory given  Plan Discussed with: CRNA and Surgeon  Anesthesia Plan Comments:        Anesthesia Quick Evaluation

## 2018-03-08 NOTE — Interval H&P Note (Signed)
History and Physical Interval Note:  03/08/2018 10:28 AM  Monique Lopez  has presented today for surgery, with the diagnosis of thyroid neoplasm of uncertian behavior  The various methods of treatment have been discussed with the patient and family. After consideration of risks, benefits and other options for treatment, the patient has consented to  Procedure(s): LEFT THYROID LOBECTOMY (Left) as a surgical intervention .  The patient's history has been reviewed, patient examined, no change in status, stable for surgery.  I have reviewed the patient's chart and labs.  Questions were answered to the patient's satisfaction.    Armandina Gemma, [place] [facility] Office: [place]

## 2018-03-09 ENCOUNTER — Encounter (HOSPITAL_COMMUNITY): Payer: Self-pay | Admitting: Surgery

## 2018-03-09 DIAGNOSIS — Z880 Allergy status to penicillin: Secondary | ICD-10-CM | POA: Diagnosis not present

## 2018-03-09 DIAGNOSIS — C73 Malignant neoplasm of thyroid gland: Secondary | ICD-10-CM | POA: Diagnosis not present

## 2018-03-09 DIAGNOSIS — Z886 Allergy status to analgesic agent status: Secondary | ICD-10-CM | POA: Diagnosis not present

## 2018-03-09 DIAGNOSIS — Z79899 Other long term (current) drug therapy: Secondary | ICD-10-CM | POA: Diagnosis not present

## 2018-03-09 DIAGNOSIS — Z9049 Acquired absence of other specified parts of digestive tract: Secondary | ICD-10-CM | POA: Diagnosis not present

## 2018-03-09 DIAGNOSIS — F17200 Nicotine dependence, unspecified, uncomplicated: Secondary | ICD-10-CM | POA: Diagnosis not present

## 2018-03-09 MED ORDER — TRAMADOL HCL 50 MG PO TABS: 50.0000 mg | ORAL_TABLET | Freq: Four times a day (QID) | ORAL | 0 refills | Status: DC | PRN

## 2018-03-09 MED ORDER — PHENOL 1.4 % MT LIQD
1.0000 | OROMUCOSAL | Status: DC | PRN
  Filled 2018-03-09: qty 177

## 2018-03-09 NOTE — Discharge Summary (Signed)
Physician Discharge Summary [facility]  Patient ID: Monique Lopez MRN: [id number] DOB/AGE: [birth year] 68 y.o.  Admit date: 03/08/2018 Discharge date: 03/09/2018  Admission Diagnoses:  thyroid neoplasm of uncertain behavior  Discharge Diagnoses:  Principal Problem:   Neoplasm of uncertain behavior of thyroid gland   Discharged Condition: good  Hospital Course: Patient was admitted for observation following thyroid surgery.  Post op course was uncomplicated.  Pain was well controlled.  Tolerated diet.  Patient was prepared for discharge home on POD#1.  Consults: None  Treatments: surgery: left thyroid lobectomy  Discharge Exam: Blood pressure 113/73, pulse 72, temperature 98 F (36.7 C), resp. rate 16, height 5\' 7"  (1.702 m), weight 57.2 kg, SpO2 96 %. HEENT - clear Neck - wound dry and intact; minimal STS; voice normal Chest - clear bilaterally Cor - RRR  Disposition: Home  Discharge Instructions    Diet - low sodium heart healthy   Complete by:  As directed    Discharge instructions   Complete by:  As directed    [facility]  THYROID & PARATHYROID SURGERY:  POST-OP INSTRUCTIONS  Always review your discharge instruction sheet from the facility where your surgery was performed.  A prescription for pain medication may be given to you upon discharge.  Take your pain medication as prescribed.  If narcotic pain medicine is not needed, then you may take acetaminophen (Tylenol) or ibuprofen (Advil) as needed.  Take your usually prescribed medications unless otherwise directed.  If you need a refill on your pain medication, please contact our office during regular business hours.  Prescriptions cannot be processed by our office after 5 pm or on weekends.  Start with a light diet upon arrival home, such as soup and crackers or toast.  Be sure to drink plenty of fluids daily.  Resume your normal diet the day after surgery.  Most  patients will experience some swelling and bruising on the chest and neck area.  Ice packs will help.  Swelling and bruising can take several days to resolve.   It is common to experience some constipation after surgery.  Increasing fluid intake and taking a stool softener (Colace) will usually help or prevent this problem.  A mild laxative (Milk of Magnesia or Miralax) should be taken according to package directions if there has been no bowel movement after 48 hours.  You have steri-strips and a gauze dressing over your incision.  You may remove the gauze bandage on the second day after surgery, and you may shower at that time.  Leave your steri-strips (small skin tapes) in place directly over the incision.  These strips should remain on the skin for 5-7 days and then be removed.  You may get them wet in the shower and pat them dry.  You may resume regular (light) daily activities beginning the next day (such as daily self-care, walking, climbing stairs) gradually increasing activities as tolerated.  You may have sexual intercourse when it is comfortable.  Refrain from any heavy lifting or straining until approved by your doctor.  You may drive when you no longer are taking prescription pain medication, you can comfortably wear a seatbelt, and you can safely maneuver your car and apply brakes.  You should see your doctor in the office for a follow-up appointment approximately three weeks after your surgery.  Make sure that you call for this appointment within a day or two after you arrive home to insure a convenient  appointment time.  WHEN TO CALL YOUR DOCTOR: -- Fever greater than 101.5 -- Inability to urinate -- Nausea and/or vomiting - persistent -- Extreme swelling or bruising -- Continued bleeding from incision -- Increased pain, redness, or drainage from the incision -- Difficulty swallowing or breathing -- Muscle cramping or spasms -- Numbness or tingling in hands or around lips  The  clinic staff is available to answer your questions during regular business hours.  Please don't hesitate to call and ask to speak to one of the nurses if you have concerns.  Armandina Gemma, MD [facility] Office: [phone]   Increase activity slowly   Complete by:  As directed    No dressing needed   Complete by:  As directed      Allergies as of 03/09/2018      Reactions   Amoxicillin Diarrhea, Nausea And Vomiting      Medication List    TAKE these medications   ALPRAZolam 0.25 MG tablet Commonly known as:  XANAX Take 0.125 mg by mouth at bedtime.   atorvastatin 40 MG tablet Commonly known as:  LIPITOR Take 1 tablet (40 mg total) by mouth daily.   traMADol 50 MG tablet Commonly known as:  ULTRAM Take 1-2 tablets (50-100 mg total) by mouth every 6 (six) hours as needed.   VISINE 0.05 % ophthalmic solution Generic drug:  tetrahydrozoline Place 2 drops into both eyes daily as needed (for dry eyes).      Follow-up Information    Armandina Gemma, MD. Schedule an appointment as soon as possible for a visit in 3 week(s).   Specialty:  General Surgery Contact information: [address] [place] [address] [phone]           Earnstine Regal, MD, [facility] Office: [phone]   Signed: Armandina Gemma 03/09/2018, 2:06 PM

## 2018-03-09 NOTE — Plan of Care (Signed)

## 2018-03-19 NOTE — Anesthesia Postprocedure Evaluation (Signed)
Anesthesia Post Note  Patient: Monique Lopez  Procedure(s) Performed: LEFT THYROID LOBECTOMY (Left )     Patient location during evaluation: PACU Anesthesia Type: General Level of consciousness: awake and alert Pain management: pain level controlled Vital Signs Assessment: post-procedure vital signs reviewed and stable Respiratory status: spontaneous breathing, nonlabored ventilation, respiratory function stable and patient connected to nasal cannula oxygen Cardiovascular status: blood pressure returned to baseline and stable Postop Assessment: no apparent nausea or vomiting Anesthetic complications: no    Last Vitals:  Vitals:   03/09/18 0543 03/09/18 1055  BP: (!) 106/57 113/73  Pulse: 69 72  Resp: 16 16  Temp: 36.5 C 36.7 C  SpO2: 97% 96%    Last Pain:  Vitals:   03/09/18 0910  TempSrc:   PainSc: 0-No pain                 Gillermo Poch

## 2018-03-21 ENCOUNTER — Ambulatory Visit: Payer: Self-pay | Admitting: Surgery

## 2018-03-22 NOTE — Patient Instructions (Addendum)
Monique Lopez  03/22/2018   Your procedure is scheduled on: 03-26-2018    Report to [facility] Main  Entrance     Report to admitting at 10:30AM    Call this number if you have problems the morning of surgery [phone]      Remember: Do not eat food or drink liquids :After Midnight. BRUSH YOUR TEETH MORNING OF SURGERY AND RINSE YOUR MOUTH OUT, NO CHEWING GUM CANDY OR MINTS.     Take these medicines the morning of surgery with A SIP OF WATER: none                                You may not have any metal on your body including hair pins and              piercings  Do not wear jewelry, make-up, lotions, powders or perfumes, deodorant          Do not bring valuables to the hospital. [place].  Contacts, dentures or bridgework may not be worn into surgery.  Leave suitcase in the car. After surgery it may be brought to your room.     Patients discharged the day of surgery will not be allowed to drive home. IF YOU ARE HAVING SURGERY AND GOING HOME THE SAME DAY, YOU MUST HAVE AN ADULT TO DRIVE YOU HOME AND BE WITH YOU FOR 24 HOURS. YOU MAY GO HOME BY TAXI OR UBER OR ORTHERWISE, BUT AN ADULT MUST ACCOMPANY YOU HOME AND STAY WITH YOU FOR 24 HOURS.  Name and phone number of your driver:  Special Instructions: N/A              Please read over the following fact sheets you were given: _____________________________________________________________________             [facility] - Preparing for Surgery Before surgery, you can play an important role.  Because skin is not sterile, your skin needs to be as free of germs as possible.  You can reduce the number of germs on your skin by washing with CHG (chlorahexidine gluconate) soap before surgery.  CHG is an antiseptic cleaner which kills germs and bonds with the skin to continue killing germs even after washing. Please DO NOT use if you have an allergy to CHG or  antibacterial soaps.  If your skin becomes reddened/irritated stop using the CHG and inform your nurse when you arrive at Short Stay. Do not shave (including legs and underarms) for at least 48 hours prior to the first CHG shower.  You may shave your face/neck. Please follow these instructions carefully:  1.  Shower with CHG Soap the night before surgery and the  morning of Surgery.  2.  If you choose to wash your hair, wash your hair first as usual with your  normal  shampoo.  3.  After you shampoo, rinse your hair and body thoroughly to remove the  shampoo.                           4.  Use CHG as you would any other liquid soap.  You can apply chg directly  to the skin and wash  Gently with a scrungie or clean washcloth.  5.  Apply the CHG Soap to your body ONLY FROM THE NECK DOWN.   Do not use on face/ open                           Wound or open sores. Avoid contact with eyes, ears mouth and genitals (private parts).                       Wash face,  Genitals (private parts) with your normal soap.             6.  Wash thoroughly, paying special attention to the area where your surgery  will be performed.  7.  Thoroughly rinse your body with warm water from the neck down.  8.  DO NOT shower/wash with your normal soap after using and rinsing off  the CHG Soap.                9.  Pat yourself dry with a clean towel.            10.  Wear clean pajamas.            11.  Place clean sheets on your bed the night of your first shower and do not  sleep with pets. Day of Surgery : Do not apply any lotions/deodorants the morning of surgery.  Please wear clean clothes to the hospital/surgery center.  FAILURE TO FOLLOW THESE INSTRUCTIONS MAY RESULT IN THE CANCELLATION OF YOUR SURGERY PATIENT SIGNATURE_________________________________  NURSE SIGNATURE__________________________________  ________________________________________________________________________

## 2018-03-23 ENCOUNTER — Ambulatory Visit (HOSPITAL_COMMUNITY)
Admission: RE | Admit: 2018-03-23 | Discharge: 2018-03-23 | Disposition: A | Discharge status: Home / Self Care | Payer: Medicare Other | Source: Ambulatory Visit | Attending: Anesthesiology | Admitting: Anesthesiology

## 2018-03-23 ENCOUNTER — Encounter (HOSPITAL_COMMUNITY): Payer: Self-pay

## 2018-03-23 ENCOUNTER — Other Ambulatory Visit: Payer: Self-pay

## 2018-03-23 ENCOUNTER — Encounter (HOSPITAL_COMMUNITY)
Admission: RE | Admit: 2018-03-23 | Discharge: 2018-03-23 | Disposition: A | Discharge status: Home / Self Care | Payer: Medicare Other | Source: Ambulatory Visit | Attending: Surgery | Admitting: Surgery

## 2018-03-23 DIAGNOSIS — Z01811 Encounter for preprocedural respiratory examination: Secondary | ICD-10-CM | POA: Insufficient documentation

## 2018-03-23 DIAGNOSIS — J439 Emphysema, unspecified: Secondary | ICD-10-CM | POA: Diagnosis not present

## 2018-03-23 DIAGNOSIS — Z01818 Encounter for other preprocedural examination: Secondary | ICD-10-CM | POA: Diagnosis not present

## 2018-03-23 HISTORY — DX: Other specified health status: Z78.9

## 2018-03-23 HISTORY — DX: Reserved for inherently not codable concepts without codable children: IMO0001

## 2018-03-23 LAB — CBC
HCT: 43.7 % (ref 36.0–46.0)
HEMOGLOBIN: 13.9 g/dL (ref 12.0–15.0)
MCH: 29.6 pg (ref 26.0–34.0)
MCHC: 31.8 g/dL (ref 30.0–36.0)
MCV: 93.2 fL (ref 80.0–100.0)
Platelets: 241 10*3/uL (ref 150–400)
RBC: 4.69 MIL/uL (ref 3.87–5.11)
RDW: 13 % (ref 11.5–15.5)
WBC: 8.2 10*3/uL (ref 4.0–10.5)
nRBC: 0 % (ref 0.0–0.2)

## 2018-03-23 LAB — BASIC METABOLIC PANEL
Anion gap: 6 (ref 5–15)
BUN: 18 mg/dL (ref 8–23)
CO2: 29 mmol/L (ref 22–32)
Calcium: 9.7 mg/dL (ref 8.9–10.3)
Chloride: 106 mmol/L (ref 98–111)
Creatinine, Ser: 0.7 mg/dL (ref 0.44–1.00)
GFR calc Af Amer: >60 mL/min (ref >60)
GFR calc non Af Amer: >60 mL/min (ref >60)
Glucose, Bld: 93 mg/dL (ref 70–99)
POTASSIUM: 5.2 mmol/L — AB (ref 3.5–5.1)
Sodium: 141 mmol/L (ref 135–145)

## 2018-03-24 ENCOUNTER — Encounter (HOSPITAL_COMMUNITY): Payer: Self-pay | Admitting: Surgery

## 2018-03-24 DIAGNOSIS — C73 Malignant neoplasm of thyroid gland: Secondary | ICD-10-CM | POA: Diagnosis present

## 2018-03-24 NOTE — H&P (View-Only) (Signed)
General Surgery [facility]  ADDENDUM to H&P dated 03/07/2018  Josiah Lobo DOB: 1951/02/06 Single / Language: Cleophus Molt / Race: White Female   History of Present Illness   The patient is a 68 year old female presenting for a post-operative visit.  CHIEF COMPLAINT: post op thyroid lobectomy, Hurthle cell carcinoma  Patient returns for first postoperative visit having undergone left thyroid lobectomy and isthmusectomy on March 08, 2018. Final pathology shows a oncocytic carcinoma, Hurthle cell carcinoma, encapsulated, 2.4 cm, with angioedema invasion. 4 lymph nodes were negative for metastatic disease. Margins were negative. I have discussed the case with her endocrinologist, Dr. Renato Shin, who agrees that this case is marginal for observation versus completion thyroidectomy followed by radioactive iodine treatment. Patient comes in today to discuss these issues and to make a decision regarding further surgery. Postoperative course has been uncomplicated.   Problem List/Past Medical NEOPLASM OF UNCERTAIN BEHAVIOR OF THYROID GLAND (D44.0)  HURTHLE CELL CARCINOMA OF THYROID (C73)  HISTORY OF LOBECTOMY OF THYROID (Z90.09)   Past Surgical History  Gallbladder Surgery - Laparoscopic   Diagnostic Studies History  Mammogram  1-3 years ago Pap Smear  1-5 years ago  Allergies  Motrin *ANALGESICS - ANTI-INFLAMMATORY*  Penicillins  Allergies Reconciled   Medication History ALPRAZolam (0.25MG  Tablet, Oral) Active. Atorvastatin Calcium (40MG  Tablet, Oral) Active. ZyrTEC Allergy (10MG  Tablet, Oral) Active. Medications Reconciled  Social History  Alcohol use  Occasional alcohol use. Caffeine use  Carbonated beverages, Coffee. No drug use  Tobacco use  Current every day smoker.  Family History Alcohol Abuse  Son. Arthritis  Father, Mother. Cancer  Mother. Diabetes Mellitus  Mother. Kidney Disease  Daughter. Respiratory  Condition  Mother.  Pregnancy / Birth History Age of menopause  19-55 Gravida  3 Maternal age  64-20 Para  3  Other Problems Hypercholesterolemia   Review of Systems  General Not Present- Appetite Loss, Chills, Fatigue, Fever, Night Sweats, Weight Gain and Weight Loss. Skin Present- Dryness. Not Present- Change in Wart/Mole, Hives, Jaundice, New Lesions, Non-Healing Wounds, Rash and Ulcer. HEENT Present- Seasonal Allergies. Not Present- Earache, Hearing Loss, Hoarseness, Nose Bleed, Oral Ulcers, Ringing in the Ears, Sinus Pain, Sore Throat, Visual Disturbances, Wears glasses/contact lenses and Yellow Eyes. Respiratory Present- Chronic Cough. Not Present- Bloody sputum, Difficulty Breathing, Snoring and Wheezing. Breast Not Present- Breast Mass, Breast Pain, Nipple Discharge and Skin Changes. Cardiovascular Not Present- Chest Pain, Difficulty Breathing Lying Down, Leg Cramps, Palpitations, Rapid Heart Rate, Shortness of Breath and Swelling of Extremities. Gastrointestinal Not Present- Abdominal Pain, Bloating, Bloody Stool, Change in Bowel Habits, Chronic diarrhea, Constipation, Difficulty Swallowing, Excessive gas, Gets full quickly at meals, Hemorrhoids, Indigestion, Nausea, Rectal Pain and Vomiting. Female Genitourinary Not Present- Frequency, Nocturia, Painful Urination, Pelvic Pain and Urgency. Musculoskeletal Not Present- Back Pain, Joint Pain, Joint Stiffness, Muscle Pain, Muscle Weakness and Swelling of Extremities. Neurological Not Present- Decreased Memory, Fainting, Headaches, Numbness, Seizures, Tingling, Tremor, Trouble walking and Weakness. Psychiatric Not Present- Anxiety, Bipolar, Change in Sleep Pattern, Depression, Fearful and Frequent crying. Endocrine Not Present- Cold Intolerance, Excessive Hunger, Hair Changes, Heat Intolerance, Hot flashes and New Diabetes. Hematology Not Present- Blood Thinners, Easy Bruising, Excessive bleeding, Gland problems, HIV and Persistent  Infections.  Vitals Weight: 130 lb Height: 67in Body Surface Area: 1.68 m Body Mass Index: 20.36 kg/m  Temp.: 97.69F(Temporal)  Pulse: 78 (Regular)  P.OX: 98% (Room air) BP: 122/68 (Sitting, Left Arm, Standard)  Physical Exam  Limited examination  Anterior cervical incision is  healing nicely. Dermabond remains in place on the incision. Mild soft tissue swelling. No sign of seroma or infection. Voice quality is normal.    Assessment & Plan  HURTHLE CELL CARCINOMA OF THYROID (C73) HISTORY OF LOBECTOMY OF THYROID (Z90.09)  Patient returns for postoperative visit and to review the pathology results. Unfortunately, this demonstrates a Hurthle cell carcinoma. There are good and bad features noted on the pathology report. Today we reviewed these features and discussed options for further management. I have also reviewed this with Dr. Renato Shin.  After discussion, the patient agrees to proceed with completion thyroidectomy with the intention of having radioactive iodine treatment later in her clinical course. I think this is the proper direction in order to give [place] the best chance of avoiding recurrent or metastatic disease. He will also make her long-term follow-up easier as we can follow thyroglobulin levels more accurately.  We will schedule the patient for completion thyroidectomy at a time convenient for her in the immediate future.  The risks and benefits of the procedure have been discussed at length with the patient. The patient understands the proposed procedure, potential alternative treatments, and the course of recovery to be expected. All of the patient's questions have been answered at this time. The patient wishes to proceed with surgery.  Armandina Gemma, [facility] Office: [phone]

## 2018-03-24 NOTE — H&P (Signed)
General Surgery [facility]  ADDENDUM to H&P dated 03/07/2018  Monique Lopez DOB: Mar 26, 1950 Single / Language: Cleophus Molt / Race: White Female   History of Present Illness   The patient is a 68 year old female presenting for a post-operative visit.  CHIEF COMPLAINT: post op thyroid lobectomy, Hurthle cell carcinoma  Patient returns for first postoperative visit having undergone left thyroid lobectomy and isthmusectomy on March 08, 2018. Final pathology shows a oncocytic carcinoma, Hurthle cell carcinoma, encapsulated, 2.4 cm, with angioedema invasion. 4 lymph nodes were negative for metastatic disease. Margins were negative. I have discussed the case with her endocrinologist, Dr. Renato Shin, who agrees that this case is marginal for observation versus completion thyroidectomy followed by radioactive iodine treatment. Patient comes in today to discuss these issues and to make a decision regarding further surgery. Postoperative course has been uncomplicated.   Problem List/Past Medical NEOPLASM OF UNCERTAIN BEHAVIOR OF THYROID GLAND (D44.0)  HURTHLE CELL CARCINOMA OF THYROID (C73)  HISTORY OF LOBECTOMY OF THYROID (Z90.09)   Past Surgical History  Gallbladder Surgery - Laparoscopic   Diagnostic Studies History  Mammogram  1-3 years ago Pap Smear  1-5 years ago  Allergies  Motrin *ANALGESICS - ANTI-INFLAMMATORY*  Penicillins  Allergies Reconciled   Medication History ALPRAZolam (0.25MG  Tablet, Oral) Active. Atorvastatin Calcium (40MG  Tablet, Oral) Active. ZyrTEC Allergy (10MG  Tablet, Oral) Active. Medications Reconciled  Social History  Alcohol use  Occasional alcohol use. Caffeine use  Carbonated beverages, Coffee. No drug use  Tobacco use  Current every day smoker.  Family History Alcohol Abuse  Son. Arthritis  Father, Mother. Cancer  Mother. Diabetes Mellitus  Mother. Kidney Disease  Daughter. Respiratory  Condition  Mother.  Pregnancy / Birth History Age of menopause  60-55 Gravida  3 Maternal age  42-20 Para  3  Other Problems Hypercholesterolemia   Review of Systems  General Not Present- Appetite Loss, Chills, Fatigue, Fever, Night Sweats, Weight Gain and Weight Loss. Skin Present- Dryness. Not Present- Change in Wart/Mole, Hives, Jaundice, New Lesions, Non-Healing Wounds, Rash and Ulcer. HEENT Present- Seasonal Allergies. Not Present- Earache, Hearing Loss, Hoarseness, Nose Bleed, Oral Ulcers, Ringing in the Ears, Sinus Pain, Sore Throat, Visual Disturbances, Wears glasses/contact lenses and Yellow Eyes. Respiratory Present- Chronic Cough. Not Present- Bloody sputum, Difficulty Breathing, Snoring and Wheezing. Breast Not Present- Breast Mass, Breast Pain, Nipple Discharge and Skin Changes. Cardiovascular Not Present- Chest Pain, Difficulty Breathing Lying Down, Leg Cramps, Palpitations, Rapid Heart Rate, Shortness of Breath and Swelling of Extremities. Gastrointestinal Not Present- Abdominal Pain, Bloating, Bloody Stool, Change in Bowel Habits, Chronic diarrhea, Constipation, Difficulty Swallowing, Excessive gas, Gets full quickly at meals, Hemorrhoids, Indigestion, Nausea, Rectal Pain and Vomiting. Female Genitourinary Not Present- Frequency, Nocturia, Painful Urination, Pelvic Pain and Urgency. Musculoskeletal Not Present- Back Pain, Joint Pain, Joint Stiffness, Muscle Pain, Muscle Weakness and Swelling of Extremities. Neurological Not Present- Decreased Memory, Fainting, Headaches, Numbness, Seizures, Tingling, Tremor, Trouble walking and Weakness. Psychiatric Not Present- Anxiety, Bipolar, Change in Sleep Pattern, Depression, Fearful and Frequent crying. Endocrine Not Present- Cold Intolerance, Excessive Hunger, Hair Changes, Heat Intolerance, Hot flashes and New Diabetes. Hematology Not Present- Blood Thinners, Easy Bruising, Excessive bleeding, Gland problems, HIV and Persistent  Infections.  Vitals Weight: 130 lb Height: 67in Body Surface Area: 1.68 m Body Mass Index: 20.36 kg/m  Temp.: 97.27F(Temporal)  Pulse: 78 (Regular)  P.OX: 98% (Room air) BP: 122/68 (Sitting, Left Arm, Standard)  Physical Exam  Limited examination  Anterior cervical incision is  healing nicely. Dermabond remains in place on the incision. Mild soft tissue swelling. No sign of seroma or infection. Voice quality is normal.    Assessment & Plan  HURTHLE CELL CARCINOMA OF THYROID (C73) HISTORY OF LOBECTOMY OF THYROID (Z90.09)  Patient returns for postoperative visit and to review the pathology results. Unfortunately, this demonstrates a Hurthle cell carcinoma. There are good and bad features noted on the pathology report. Today we reviewed these features and discussed options for further management. I have also reviewed this with Dr. Renato Shin.  After discussion, the patient agrees to proceed with completion thyroidectomy with the intention of having radioactive iodine treatment later in her clinical course. I think this is the proper direction in order to give [place] the best chance of avoiding recurrent or metastatic disease. He will also make her long-term follow-up easier as we can follow thyroglobulin levels more accurately.  We will schedule the patient for completion thyroidectomy at a time convenient for her in the immediate future.  The risks and benefits of the procedure have been discussed at length with the patient. The patient understands the proposed procedure, potential alternative treatments, and the course of recovery to be expected. All of the patient's questions have been answered at this time. The patient wishes to proceed with surgery.  Armandina Gemma, [facility] Office: [phone]

## 2018-03-26 ENCOUNTER — Ambulatory Visit (HOSPITAL_COMMUNITY): Payer: Medicare Other | Admitting: Anesthesiology

## 2018-03-26 ENCOUNTER — Encounter (HOSPITAL_COMMUNITY)
Admission: RE | Disposition: A | Discharge status: Home / Self Care | Payer: Self-pay | Source: Home / Self Care | Attending: Surgery

## 2018-03-26 ENCOUNTER — Ambulatory Visit (HOSPITAL_COMMUNITY): Payer: Medicare Other | Admitting: Physician Assistant

## 2018-03-26 ENCOUNTER — Encounter (HOSPITAL_COMMUNITY): Payer: Self-pay

## 2018-03-26 ENCOUNTER — Other Ambulatory Visit: Payer: Self-pay

## 2018-03-26 ENCOUNTER — Observation Stay (HOSPITAL_COMMUNITY)
Admission: RE | Admit: 2018-03-26 | Discharge: 2018-03-27 | Disposition: A | Discharge status: Home / Self Care | Payer: Medicare Other | Attending: Surgery | Admitting: Surgery

## 2018-03-26 DIAGNOSIS — Z880 Allergy status to penicillin: Secondary | ICD-10-CM | POA: Diagnosis not present

## 2018-03-26 DIAGNOSIS — Z9889 Other specified postprocedural states: Secondary | ICD-10-CM | POA: Diagnosis not present

## 2018-03-26 DIAGNOSIS — Z8261 Family history of arthritis: Secondary | ICD-10-CM | POA: Diagnosis not present

## 2018-03-26 DIAGNOSIS — E7800 Pure hypercholesterolemia, unspecified: Secondary | ICD-10-CM | POA: Diagnosis not present

## 2018-03-26 DIAGNOSIS — E041 Nontoxic single thyroid nodule: Secondary | ICD-10-CM | POA: Diagnosis not present

## 2018-03-26 DIAGNOSIS — Z841 Family history of disorders of kidney and ureter: Secondary | ICD-10-CM | POA: Diagnosis not present

## 2018-03-26 DIAGNOSIS — C73 Malignant neoplasm of thyroid gland: Principal | ICD-10-CM | POA: Diagnosis present

## 2018-03-26 DIAGNOSIS — E785 Hyperlipidemia, unspecified: Secondary | ICD-10-CM | POA: Diagnosis not present

## 2018-03-26 DIAGNOSIS — Z79899 Other long term (current) drug therapy: Secondary | ICD-10-CM | POA: Insufficient documentation

## 2018-03-26 DIAGNOSIS — K219 Gastro-esophageal reflux disease without esophagitis: Secondary | ICD-10-CM | POA: Diagnosis not present

## 2018-03-26 DIAGNOSIS — Z836 Family history of other diseases of the respiratory system: Secondary | ICD-10-CM | POA: Diagnosis not present

## 2018-03-26 DIAGNOSIS — J449 Chronic obstructive pulmonary disease, unspecified: Secondary | ICD-10-CM | POA: Diagnosis not present

## 2018-03-26 DIAGNOSIS — Z833 Family history of diabetes mellitus: Secondary | ICD-10-CM | POA: Insufficient documentation

## 2018-03-26 DIAGNOSIS — Z809 Family history of malignant neoplasm, unspecified: Secondary | ICD-10-CM | POA: Insufficient documentation

## 2018-03-26 DIAGNOSIS — F17200 Nicotine dependence, unspecified, uncomplicated: Secondary | ICD-10-CM | POA: Insufficient documentation

## 2018-03-26 DIAGNOSIS — Z886 Allergy status to analgesic agent status: Secondary | ICD-10-CM | POA: Diagnosis not present

## 2018-03-26 DIAGNOSIS — Z811 Family history of alcohol abuse and dependence: Secondary | ICD-10-CM | POA: Diagnosis not present

## 2018-03-26 HISTORY — PX: THYROIDECTOMY: SHX17

## 2018-03-26 SURGERY — THYROIDECTOMY
Anesthesia: General | Site: Neck

## 2018-03-26 MED ORDER — HYDROMORPHONE HCL 1 MG/ML IJ SOLN
0.2500 mg | INTRAMUSCULAR | Status: DC | PRN
  Administered 2018-03-26 (×2): 0.5 mg via INTRAVENOUS

## 2018-03-26 MED ORDER — PROPOFOL 10 MG/ML IV BOLUS
INTRAVENOUS | Status: AC
  Filled 2018-03-26: qty 20

## 2018-03-26 MED ORDER — ONDANSETRON HCL 4 MG/2ML IJ SOLN
INTRAMUSCULAR | Status: AC
  Filled 2018-03-26: qty 2

## 2018-03-26 MED ORDER — TRAMADOL HCL 50 MG PO TABS
50.0000 mg | ORAL_TABLET | Freq: Four times a day (QID) | ORAL | Status: DC | PRN
  Administered 2018-03-26: 50 mg via ORAL
  Filled 2018-03-26: qty 1

## 2018-03-26 MED ORDER — MIDAZOLAM HCL 2 MG/2ML IJ SOLN
INTRAMUSCULAR | Status: AC
  Filled 2018-03-26: qty 2

## 2018-03-26 MED ORDER — EPHEDRINE 5 MG/ML INJ
INTRAVENOUS | Status: AC
  Filled 2018-03-26: qty 10

## 2018-03-26 MED ORDER — FENTANYL CITRATE (PF) 250 MCG/5ML IJ SOLN
INTRAMUSCULAR | Status: AC
  Filled 2018-03-26: qty 5

## 2018-03-26 MED ORDER — HYDROMORPHONE HCL 1 MG/ML IJ SOLN
INTRAMUSCULAR | Status: AC
  Filled 2018-03-26: qty 2

## 2018-03-26 MED ORDER — LIDOCAINE HCL (CARDIAC) PF 100 MG/5ML IV SOSY
PREFILLED_SYRINGE | INTRAVENOUS | Status: DC | PRN
  Administered 2018-03-26: 20 mg via INTRAVENOUS
  Administered 2018-03-26: 30 mg via INTRAVENOUS

## 2018-03-26 MED ORDER — EPHEDRINE SULFATE 50 MG/ML IJ SOLN
INTRAMUSCULAR | Status: DC | PRN
  Administered 2018-03-26: 10 mg via INTRAVENOUS

## 2018-03-26 MED ORDER — PROPOFOL 10 MG/ML IV BOLUS
INTRAVENOUS | Status: DC | PRN
  Administered 2018-03-26: 20 mg via INTRAVENOUS
  Administered 2018-03-26: 110 mg via INTRAVENOUS
  Administered 2018-03-26: 20 mg via INTRAVENOUS

## 2018-03-26 MED ORDER — ESMOLOL HCL 100 MG/10ML IV SOLN
INTRAVENOUS | Status: DC | PRN
  Administered 2018-03-26: 15 mg via INTRAVENOUS
  Administered 2018-03-26 (×3): 10 mg via INTRAVENOUS

## 2018-03-26 MED ORDER — PROMETHAZINE HCL 25 MG/ML IJ SOLN: 6.2500 mg | INTRAMUSCULAR | Status: DC | PRN

## 2018-03-26 MED ORDER — 0.9 % SODIUM CHLORIDE (POUR BTL) OPTIME
TOPICAL | Status: DC | PRN
  Administered 2018-03-26: 1000 mL

## 2018-03-26 MED ORDER — LIDOCAINE 2% (20 MG/ML) 5 ML SYRINGE
INTRAMUSCULAR | Status: AC
  Filled 2018-03-26: qty 5

## 2018-03-26 MED ORDER — MIDAZOLAM HCL 5 MG/5ML IJ SOLN
INTRAMUSCULAR | Status: DC | PRN
  Administered 2018-03-26 (×2): 1 mg via INTRAVENOUS

## 2018-03-26 MED ORDER — PHENYLEPHRINE 40 MCG/ML (10ML) SYRINGE FOR IV PUSH (FOR BLOOD PRESSURE SUPPORT)
PREFILLED_SYRINGE | INTRAVENOUS | Status: AC
  Filled 2018-03-26: qty 10

## 2018-03-26 MED ORDER — CHLORHEXIDINE GLUCONATE CLOTH 2 % EX PADS: 6.0000 | MEDICATED_PAD | Freq: Once | CUTANEOUS | Status: DC

## 2018-03-26 MED ORDER — CIPROFLOXACIN IN D5W 400 MG/200ML IV SOLN
400.0000 mg | INTRAVENOUS | Status: AC
  Administered 2018-03-26: 400 mg via INTRAVENOUS
  Filled 2018-03-26: qty 200

## 2018-03-26 MED ORDER — SUGAMMADEX SODIUM 200 MG/2ML IV SOLN
INTRAVENOUS | Status: DC | PRN
  Administered 2018-03-26: 120 mg via INTRAVENOUS

## 2018-03-26 MED ORDER — DEXAMETHASONE SODIUM PHOSPHATE 10 MG/ML IJ SOLN
INTRAMUSCULAR | Status: AC
  Filled 2018-03-26: qty 1

## 2018-03-26 MED ORDER — LACTATED RINGERS IV SOLN
INTRAVENOUS | Status: DC
  Administered 2018-03-26: 12:00:00 via INTRAVENOUS

## 2018-03-26 MED ORDER — SUGAMMADEX SODIUM 200 MG/2ML IV SOLN
INTRAVENOUS | Status: AC
  Filled 2018-03-26: qty 2

## 2018-03-26 MED ORDER — KCL IN DEXTROSE-NACL 20-5-0.45 MEQ/L-%-% IV SOLN
INTRAVENOUS | Status: DC
  Administered 2018-03-26: 17:00:00 via INTRAVENOUS
  Filled 2018-03-26: qty 1000

## 2018-03-26 MED ORDER — ROCURONIUM BROMIDE 100 MG/10ML IV SOLN
INTRAVENOUS | Status: DC | PRN
  Administered 2018-03-26: 40 mg via INTRAVENOUS

## 2018-03-26 MED ORDER — ROCURONIUM BROMIDE 100 MG/10ML IV SOLN
INTRAVENOUS | Status: AC
  Filled 2018-03-26: qty 1

## 2018-03-26 MED ORDER — DEXAMETHASONE SODIUM PHOSPHATE 10 MG/ML IJ SOLN
INTRAMUSCULAR | Status: DC | PRN
  Administered 2018-03-26: 5 mg via INTRAVENOUS

## 2018-03-26 MED ORDER — ONDANSETRON HCL 4 MG/2ML IJ SOLN: 4.0000 mg | Freq: Four times a day (QID) | INTRAMUSCULAR | Status: DC | PRN

## 2018-03-26 MED ORDER — FENTANYL CITRATE (PF) 100 MCG/2ML IJ SOLN
INTRAMUSCULAR | Status: DC | PRN
  Administered 2018-03-26: 50 ug via INTRAVENOUS
  Administered 2018-03-26 (×3): 25 ug via INTRAVENOUS
  Administered 2018-03-26: 50 ug via INTRAVENOUS

## 2018-03-26 MED ORDER — ONDANSETRON HCL 4 MG/2ML IJ SOLN
INTRAMUSCULAR | Status: DC | PRN
  Administered 2018-03-26: 4 mg via INTRAVENOUS

## 2018-03-26 MED ORDER — PHENYLEPHRINE 40 MCG/ML (10ML) SYRINGE FOR IV PUSH (FOR BLOOD PRESSURE SUPPORT)
PREFILLED_SYRINGE | INTRAVENOUS | Status: DC | PRN
  Administered 2018-03-26 (×2): 80 ug via INTRAVENOUS

## 2018-03-26 MED ORDER — PNEUMOCOCCAL VAC POLYVALENT 25 MCG/0.5ML IJ INJ
0.5000 mL | INJECTION | INTRAMUSCULAR | Status: DC
  Filled 2018-03-26: qty 0.5

## 2018-03-26 MED ORDER — ACETAMINOPHEN 325 MG PO TABS: 650.0000 mg | ORAL_TABLET | Freq: Four times a day (QID) | ORAL | Status: DC | PRN

## 2018-03-26 MED ORDER — ACETAMINOPHEN 650 MG RE SUPP: 650.0000 mg | Freq: Four times a day (QID) | RECTAL | Status: DC | PRN

## 2018-03-26 MED ORDER — CALCIUM CARBONATE 1250 (500 CA) MG PO TABS
2.0000 | ORAL_TABLET | Freq: Three times a day (TID) | ORAL | Status: DC
  Administered 2018-03-26 – 2018-03-27 (×3): 1000 mg via ORAL
  Filled 2018-03-26 (×4): qty 1

## 2018-03-26 MED ORDER — OXYCODONE HCL 5 MG PO TABS: 5.0000 mg | ORAL_TABLET | ORAL | Status: DC | PRN

## 2018-03-26 MED ORDER — ONDANSETRON 4 MG PO TBDP: 4.0000 mg | ORAL_TABLET | Freq: Four times a day (QID) | ORAL | Status: DC | PRN

## 2018-03-26 MED ORDER — HYDROMORPHONE HCL 1 MG/ML IJ SOLN: 1.0000 mg | INTRAMUSCULAR | Status: DC | PRN

## 2018-03-26 MED ORDER — ALPRAZOLAM 0.25 MG PO TABS
0.1250 mg | ORAL_TABLET | Freq: Every day | ORAL | Status: DC
  Administered 2018-03-26: 0.125 mg via ORAL
  Filled 2018-03-26: qty 1

## 2018-03-26 SURGICAL SUPPLY — 31 items
ADH SKN CLS APL DERMABOND .7 (GAUZE/BANDAGES/DRESSINGS) ×1
ATTRACTOMAT 16X20 MAGNETIC DRP (DRAPES) ×3 IMPLANT
BLADE SURG 15 STRL LF DISP TIS (BLADE) ×1 IMPLANT
BLADE SURG 15 STRL SS (BLADE) ×3
CHLORAPREP W/TINT 26ML (MISCELLANEOUS) ×6 IMPLANT
CLIP VESOCCLUDE MED 6/CT (CLIP) ×6 IMPLANT
CLIP VESOCCLUDE SM WIDE 6/CT (CLIP) ×8 IMPLANT
COVER SURGICAL LIGHT HANDLE (MISCELLANEOUS) ×3 IMPLANT
COVER WAND RF STERILE (DRAPES) ×3 IMPLANT
DERMABOND ADVANCED (GAUZE/BANDAGES/DRESSINGS) ×2
DERMABOND ADVANCED .7 DNX12 (GAUZE/BANDAGES/DRESSINGS) IMPLANT
DRAPE LAPAROTOMY T 98X78 PEDS (DRAPES) ×3 IMPLANT
ELECT PENCIL ROCKER SW 15FT (MISCELLANEOUS) ×3 IMPLANT
ELECT REM PT RETURN 15FT ADLT (MISCELLANEOUS) ×3 IMPLANT
GAUZE 4X4 16PLY RFD (DISPOSABLE) ×3 IMPLANT
GLOVE BIOGEL PI IND STRL 7.5 (GLOVE) IMPLANT
GLOVE BIOGEL PI INDICATOR 7.5 (GLOVE) ×4
GLOVE SURG ORTHO 8.0 STRL STRW (GLOVE) ×3 IMPLANT
GLOVE SURG SIGNA 7.5 PF LTX (GLOVE) ×2 IMPLANT
GOWN STRL REUS W/TWL XL LVL3 (GOWN DISPOSABLE) ×8 IMPLANT
HEMOSTAT SURGICEL 2X4 FIBR (HEMOSTASIS) ×2 IMPLANT
ILLUMINATOR WAVEGUIDE N/F (MISCELLANEOUS) ×2 IMPLANT
KIT BASIN OR (CUSTOM PROCEDURE TRAY) ×3 IMPLANT
PACK BASIC VI WITH GOWN DISP (CUSTOM PROCEDURE TRAY) ×3 IMPLANT
SHEARS HARMONIC 9CM CVD (BLADE) ×3 IMPLANT
SUT MNCRL AB 4-0 PS2 18 (SUTURE) ×3 IMPLANT
SUT VIC AB 3-0 SH 18 (SUTURE) ×6 IMPLANT
SYR BULB IRRIGATION 50ML (SYRINGE) ×3 IMPLANT
TOWEL OR 17X26 10 PK STRL BLUE (TOWEL DISPOSABLE) ×3 IMPLANT
TOWEL OR NON WOVEN STRL DISP B (DISPOSABLE) ×3 IMPLANT
YANKAUER SUCT BULB TIP 10FT TU (MISCELLANEOUS) ×3 IMPLANT

## 2018-03-26 NOTE — Anesthesia Preprocedure Evaluation (Signed)
Anesthesia Evaluation  Patient identified by MRN, date of birth, ID band Patient awake    Reviewed: Allergy & Precautions, NPO status , Patient's Chart, lab work & pertinent test results  History of Anesthesia Complications (+) PONV  Airway Mallampati: II  TM Distance: >3 FB Neck ROM: Full    Dental no notable dental hx.    Pulmonary COPD, Current Smoker,    Pulmonary exam normal breath sounds clear to auscultation       Cardiovascular negative cardio ROS Normal cardiovascular exam Rhythm:Regular Rate:Normal     Neuro/Psych negative neurological ROS  negative psych ROS   GI/Hepatic Neg liver ROS, GERD  ,  Endo/Other  negative endocrine ROS  Renal/GU negative Renal ROS  negative genitourinary   Musculoskeletal negative musculoskeletal ROS (+)   Abdominal   Peds negative pediatric ROS (+)  Hematology negative hematology ROS (+)   Anesthesia Other Findings   Reproductive/Obstetrics negative OB ROS                             Anesthesia Physical Anesthesia Plan  ASA: III  Anesthesia Plan: General   Post-op Pain Management:    Induction: Intravenous  PONV Risk Score and Plan: 3 and Ondansetron, Dexamethasone, Treatment may vary due to age or medical condition and Scopolamine patch - Pre-op  Airway Management Planned: Oral ETT  Additional Equipment:   Intra-op Plan:   Post-operative Plan: Extubation in OR  Informed Consent: I have reviewed the patients History and Physical, chart, labs and discussed the procedure including the risks, benefits and alternatives for the proposed anesthesia with the patient or authorized representative who has indicated his/her understanding and acceptance.     Dental advisory given  Plan Discussed with: CRNA and Surgeon  Anesthesia Plan Comments:         Anesthesia Quick Evaluation

## 2018-03-26 NOTE — Op Note (Signed)
Procedure Note  Pre-operative Diagnosis:  Hurthle cell carcinoma of the thyroid  Post-operative Diagnosis:  same  Surgeon:  Armandina Gemma, MD  Assistant:  Alphonsa Overall, MD   Procedure:  Completion thyroidectomy (right thyroid lobe)  Anesthesia:  General  Estimated Blood Loss:  minimal  Drains: none         Specimen: thyroid lobe to pathology  Indications:  Patient returns having undergone left thyroid lobectomy and isthmusectomy on March 08, 2018. Final pathology shows a oncocytic carcinoma, Hurthle cell carcinoma, encapsulated, 2.4 cm, with angioedema invasion. 4 lymph nodes were negative for metastatic disease. Margins were negative. I have discussed the case with her endocrinologist, Dr. Renato Shin, who agrees that this case is marginal for observation versus completion thyroidectomy followed by radioactive iodine treatment. Patient comes today for completion thyroidectomy. Postoperative course has been uncomplicated.  Procedure Details: Procedure was done in OR #1 at the [facility]. The patient was brought to the operating room and placed in a supine position on the operating room table. Following administration of general anesthesia, the patient was positioned and then prepped and draped in the usual aseptic fashion. After ascertaining that an adequate level of anesthesia had been achieved, the small Kocher incision was re-opened with #15 blade. Dissection was carried through subcutaneous tissues and platysma. Hemostasis was achieved with the electrocautery. Skin flaps were elevated cephalad and caudad from the thyroid notch to the sternal notch. A self-retaining retractor was placed for exposure. Strap muscles were incised in the midline and dissection was begun on the right side. Strap muscles were reflected laterally. The right thyroid lobe was grossly normal in size and without nodularity. The lobe was gently mobilized with blunt dissection. Superior pole vessels were  dissected out and divided individually between small ligaclips with the harmonic scalpel. The thyroid lobe was rolled anteriorly. Branches of the inferior thyroid artery were divided between small ligaclips with the harmonic scalpel. Inferior venous tributaries were divided between ligaclips. The recurrent laryngeal nerve was identified and preserved along its course. The ligament of Gwenlyn Found was released with the electrocautery and the gland was mobilized onto the anterior trachea from which it was completely excised with the electrocautery.  The specimen was submitted to pathology for review.  The neck was irrigated with warm saline. Fibrillar was placed throughout the operative field. Strap muscles were approximated in the midline with interrupted 3-0 Vicryl sutures. Platysma was closed with interrupted 3-0 Vicryl sutures. Skin was closed with a running 4-0 Monocryl subcuticular suture.  Wound was washed and dried and Dermabond was applied. The patient was awakened from anesthesia and brought to the recovery room. The patient tolerated the procedure well.   Armandina Gemma, MD [facility] Office: [phone]

## 2018-03-26 NOTE — Interval H&P Note (Signed)
History and Physical Interval Note:  03/26/2018 11:50 AM  Monique Lopez  has presented today for surgery, with the diagnosis of Hurthle cell carcinoma of thyroid.  The various methods of treatment have been discussed with the patient and family. After consideration of risks, benefits and other options for treatment, the patient has consented to    Procedure(s): COMPLETION THYROIDECTOMY (N/A) as a surgical intervention.    The patient's history has been reviewed, patient examined, no change in status, stable for surgery.  I have reviewed the patient's chart and labs.  Questions were answered to the patient's satisfaction.    Armandina Gemma, [facility] Office: [place]

## 2018-03-26 NOTE — Anesthesia Procedure Notes (Signed)
Procedure Name: Intubation Date/Time: 03/26/2018 12:30 PM Performed by: Garrel Ridgel, CRNA Pre-anesthesia Checklist: Patient identified, Emergency Drugs available, Suction available, Patient being monitored and Timeout performed Patient Re-evaluated:Patient Re-evaluated prior to induction Oxygen Delivery Method: Circle system utilized Preoxygenation: Pre-oxygenation with 100% oxygen Induction Type: IV induction Ventilation: Mask ventilation without difficulty Laryngoscope Size: Mac and 3 Grade View: Grade I Tube size: 7.0 mm Number of attempts: 1 Airway Equipment and Method: Stylet Placement Confirmation: ETT inserted through vocal cords under direct vision,  positive ETCO2 and breath sounds checked- equal and bilateral Secured at: 20.5 cm Tube secured with: Tape Dental Injury: Teeth and Oropharynx as per pre-operative assessment

## 2018-03-26 NOTE — Transfer of Care (Signed)
Immediate Anesthesia Transfer of Care Note  Patient: Monique Lopez  Procedure(s) Performed: COMPLETION THYROIDECTOMY (N/A Neck)  Patient Location: PACU  Anesthesia Type:General  Level of Consciousness: awake, alert  and patient cooperative  Airway & Oxygen Therapy: Patient Spontanous Breathing and Patient connected to face mask oxygen  Post-op Assessment: Report given to RN and Post -op Vital signs reviewed and stable  Post vital signs: Reviewed and stable  Last Vitals:  Vitals Value Taken Time  BP 176/95 03/26/2018  1:45 PM  Temp    Pulse 84 03/26/2018  1:47 PM  Resp 19 03/26/2018  1:47 PM  SpO2 100 % 03/26/2018  1:47 PM  Vitals shown include unvalidated device data.  Last Pain:  Vitals:   03/26/18 1130  TempSrc:   PainSc: 0-No pain         Complications: No apparent anesthesia complications

## 2018-03-26 NOTE — Interval H&P Note (Signed)
History and Physical Interval Note:  03/26/2018 11:50 AM  Monique Lopez  has presented today for surgery, with the diagnosis of Hurthle cell carcinoma of thyroid.  The various methods of treatment have been discussed with the patient and family. After consideration of risks, benefits and other options for treatment, the patient has consented to    Procedure(s): COMPLETION THYROIDECTOMY (N/A) as a surgical intervention .    The patient's history has been reviewed, patient examined, no change in status, stable for surgery.  I have reviewed the patient's chart and labs.  Questions were answered to the patient's satisfaction.    Armandina Gemma, [facility] Office: [place]

## 2018-03-27 ENCOUNTER — Encounter (HOSPITAL_COMMUNITY): Payer: Self-pay | Admitting: Surgery

## 2018-03-27 DIAGNOSIS — Z886 Allergy status to analgesic agent status: Secondary | ICD-10-CM | POA: Diagnosis not present

## 2018-03-27 DIAGNOSIS — C73 Malignant neoplasm of thyroid gland: Secondary | ICD-10-CM | POA: Diagnosis not present

## 2018-03-27 DIAGNOSIS — Z8261 Family history of arthritis: Secondary | ICD-10-CM | POA: Diagnosis not present

## 2018-03-27 DIAGNOSIS — Z880 Allergy status to penicillin: Secondary | ICD-10-CM | POA: Diagnosis not present

## 2018-03-27 DIAGNOSIS — F17200 Nicotine dependence, unspecified, uncomplicated: Secondary | ICD-10-CM | POA: Diagnosis not present

## 2018-03-27 DIAGNOSIS — Z79899 Other long term (current) drug therapy: Secondary | ICD-10-CM | POA: Diagnosis not present

## 2018-03-27 LAB — BASIC METABOLIC PANEL
Anion gap: 5 (ref 5–15)
BUN: 14 mg/dL (ref 8–23)
CO2: 29 mmol/L (ref 22–32)
Calcium: 9.1 mg/dL (ref 8.9–10.3)
Chloride: 105 mmol/L (ref 98–111)
Creatinine, Ser: 0.71 mg/dL (ref 0.44–1.00)
GFR calc Af Amer: >60 mL/min (ref >60)
GFR calc non Af Amer: >60 mL/min (ref >60)
Glucose, Bld: 134 mg/dL — ABNORMAL HIGH (ref 70–99)
POTASSIUM: 4.2 mmol/L (ref 3.5–5.1)
Sodium: 139 mmol/L (ref 135–145)

## 2018-03-27 MED ORDER — ACYCLOVIR 5 % EX OINT: TOPICAL_OINTMENT | CUTANEOUS | 0 refills | Status: DC

## 2018-03-27 MED ORDER — LEVOTHYROXINE SODIUM 88 MCG PO TABS: 88.0000 ug | ORAL_TABLET | Freq: Every day | ORAL | 3 refills | Status: DC

## 2018-03-27 MED ORDER — CALCIUM CARBONATE ANTACID 500 MG PO CHEW: 2.0000 | CHEWABLE_TABLET | Freq: Two times a day (BID) | ORAL | 1 refills | Status: DC

## 2018-03-27 NOTE — Anesthesia Postprocedure Evaluation (Signed)
Anesthesia Post Note  Patient: Monique Lopez  Procedure(s) Performed: COMPLETION THYROIDECTOMY (N/A Neck)     Patient location during evaluation: PACU Anesthesia Type: General Level of consciousness: awake and alert Pain management: pain level controlled Vital Signs Assessment: post-procedure vital signs reviewed and stable Respiratory status: spontaneous breathing, nonlabored ventilation, respiratory function stable and patient connected to nasal cannula oxygen Cardiovascular status: blood pressure returned to baseline and stable Postop Assessment: no apparent nausea or vomiting Anesthetic complications: no    Last Vitals:  Vitals:   03/27/18 0302 03/27/18 0459  BP: 124/71 102/64  Pulse: 82 62  Resp: 16 18  Temp: 36.8 C 36.9 C  SpO2: 93% 93%    Last Pain:  Vitals:   03/27/18 0722  TempSrc:   PainSc: 0-No pain                 [name]

## 2018-03-27 NOTE — Discharge Summary (Signed)
Physician Discharge Summary [facility]  Patient ID: Monique Lopez MRN: [id number] DOB/AGE: February 28, 1950 68 y.o.  Admit date: 03/26/2018 Discharge date: 03/27/2018  Admission Diagnoses:  Hurthle cell carcinoma of thyroid  Discharge Diagnoses:  Principal Problem:   Hurthle cell carcinoma of thyroid [facility])   Discharged Condition: good  Hospital Course: Patient was admitted for observation following thyroid surgery.  Post op course was uncomplicated.  Pain was well controlled.  Tolerated diet.  Post op calcium level on morning following surgery was 9.1 mg/dl.  Patient was prepared for discharge home on POD#1.  Consults: None  Treatments: surgery: completion thyroidectomy  Discharge Exam: Blood pressure 119/76, pulse 75, temperature 98.8 F (37.1 C), temperature source Oral, resp. rate 17, height 5\' 7"  (1.702 m), weight 57.2 kg, SpO2 97 %. HEENT - clear Neck - wound dry and intact; minimal STS; voice normal Chest - clear bilaterally Cor - RRR  Disposition: Home  Discharge Instructions    Diet - low sodium heart healthy   Complete by:  As directed    Discharge instructions   Complete by:  As directed    [facility]  THYROID & PARATHYROID SURGERY:  POST-OP INSTRUCTIONS  Always review your discharge instruction sheet from the facility where your surgery was performed.  A prescription for pain medication may be given to you upon discharge.  Take your pain medication as prescribed.  If narcotic pain medicine is not needed, then you may take acetaminophen (Tylenol) or ibuprofen (Advil) as needed.  Take your usually prescribed medications unless otherwise directed.  If you need a refill on your pain medication, please contact our office during regular business hours.  Prescriptions cannot be processed by our office after 5 pm or on weekends.  Start with a light diet upon arrival home, such as soup and crackers or toast.  Be sure to drink  plenty of fluids daily.  Resume your normal diet the day after surgery.  Most patients will experience some swelling and bruising on the chest and neck area.  Ice packs will help.  Swelling and bruising can take several days to resolve.   It is common to experience some constipation after surgery.  Increasing fluid intake and taking a stool softener (Colace) will usually help or prevent this problem.  A mild laxative (Milk of Magnesia or Miralax) should be taken according to package directions if there has been no bowel movement after 48 hours.  You have steri-strips and a gauze dressing over your incision.  You may remove the gauze bandage on the second day after surgery, and you may shower at that time.  Leave your steri-strips (small skin tapes) in place directly over the incision.  These strips should remain on the skin for 5-7 days and then be removed.  You may get them wet in the shower and pat them dry.  You may resume regular (light) daily activities beginning the next day (such as daily self-care, walking, climbing stairs) gradually increasing activities as tolerated.  You may have sexual intercourse when it is comfortable.  Refrain from any heavy lifting or straining until approved by your doctor.  You may drive when you no longer are taking prescription pain medication, you can comfortably wear a seatbelt, and you can safely maneuver your car and apply brakes.  You should see your doctor in the office for a follow-up appointment approximately three weeks after your surgery.  Make sure that you call for this appointment  within a day or two after you arrive home to insure a convenient appointment time.  WHEN TO CALL YOUR DOCTOR: -- Fever greater than 101.5 -- Inability to urinate -- Nausea and/or vomiting - persistent -- Extreme swelling or bruising -- Continued bleeding from incision -- Increased pain, redness, or drainage from the incision -- Difficulty swallowing or breathing --  Muscle cramping or spasms -- Numbness or tingling in hands or around lips  The clinic staff is available to answer your questions during regular business hours.  Please don't hesitate to call and ask to speak to one of the nurses if you have concerns.  Armandina Gemma, MD [facility] Office: [phone]   Increase activity slowly   Complete by:  As directed    No dressing needed   Complete by:  As directed      Allergies as of 03/27/2018      Reactions   Amoxicillin Diarrhea, Nausea And Vomiting      Medication List    TAKE these medications   acyclovir ointment 5 % Commonly known as:  ZOVIRAX Apply thin layer to affected area twice daily as needed.   ALPRAZolam 0.25 MG tablet Commonly known as:  XANAX Take 0.125 mg by mouth at bedtime.   atorvastatin 40 MG tablet Commonly known as:  LIPITOR Take 1 tablet (40 mg total) by mouth daily. What changed:  when to take this   calcium carbonate 500 MG chewable tablet Commonly known as:  TUMS Chew 2 tablets (400 mg of elemental calcium total) by mouth 2 (two) times daily.   levothyroxine 88 MCG tablet Commonly known as:  SYNTHROID Take 1 tablet (88 mcg total) by mouth daily before breakfast.   traMADol 50 MG tablet Commonly known as:  ULTRAM Take 1-2 tablets (50-100 mg total) by mouth every 6 (six) hours as needed.   VISINE 0.05 % ophthalmic solution Generic drug:  tetrahydrozoline Place 2 drops into both eyes daily as needed (for dry eyes).      Follow-up Information    Armandina Gemma, MD. Schedule an appointment as soon as possible for a visit in 3 week(s).   Specialty:  General Surgery Contact information: [address] [place] [address] [phone]        Renato Shin, MD. Schedule an appointment as soon as possible for a visit in 2 week(s).   Specialty:  Endocrinology Contact information: [address] [place] [address] [phone]            Anselmo Reihl M. Coden Franchi, MD, [facility] Office: [phone]   Signed: Armandina Gemma 03/27/2018, 11:39 AM

## 2018-03-27 NOTE — Progress Notes (Signed)
Discharge instructions given to pt and all questions were answered. Pt walked down to lobby and was picked up by her sister.

## 2018-03-29 NOTE — Progress Notes (Signed)
Please contact patient and notify of benign pathology results.  Rainen Vanrossum M. Delan Ksiazek, MD, FACS [facility] Office: [phone]   

## 2018-04-09 ENCOUNTER — Ambulatory Visit: Payer: Medicare Other | Admitting: Endocrinology

## 2018-04-13 DIAGNOSIS — C73 Malignant neoplasm of thyroid gland: Secondary | ICD-10-CM | POA: Diagnosis not present

## 2018-05-07 ENCOUNTER — Encounter: Payer: Self-pay | Admitting: Endocrinology

## 2018-05-07 ENCOUNTER — Other Ambulatory Visit: Payer: Self-pay

## 2018-05-07 ENCOUNTER — Ambulatory Visit (INDEPENDENT_AMBULATORY_CARE_PROVIDER_SITE_OTHER): Payer: Medicare Other | Admitting: Endocrinology

## 2018-05-07 DIAGNOSIS — E890 Postprocedural hypothyroidism: Secondary | ICD-10-CM

## 2018-05-07 DIAGNOSIS — E039 Hypothyroidism, unspecified: Secondary | ICD-10-CM | POA: Insufficient documentation

## 2018-05-07 LAB — T4, FREE: Free T4: 1 ng/dL (ref 0.60–1.60)

## 2018-05-07 LAB — TSH: TSH: 7.44 u[IU]/mL — AB (ref 0.35–4.50)

## 2018-05-07 NOTE — Progress Notes (Signed)
Subjective:    Patient ID: Monique Lopez, female    DOB: [birth year], 68 y.o.   MRN: [id number]  HPI  The state of at least three ongoing medical problems is addressed today, with interval history of each noted here: Pt returns for f/u of stage 2 HCTCa 1/20: Thyroid, lobectomy, left and isthmus: HURTHLE CELL CARCINOMA, ENCAPSULATED; 2.4 CM; CONFINED TO THYROID; MARGINS ARE NEGATIVE; THERE IS ANGIOINVASION, but NO LYMPHATIC INVASION; 0/4 NODES. 2/20:Thyroid, lobectomy, right: BENIGN  Denies neck pain.  This is a stable problem. She does not take vit-D.  She takes tums.  Denies muscle cramps.  Postop hypothyroidism: she denies numbness.  Past Medical History:  Diagnosis Date  . Allergy   . Anxiety   . Arthritis    hands, knees  . Complication of anesthesia   . Emphysema of lung ([place])   . GERD (gastroesophageal reflux disease)    diet controlled  . HSV infection   . Hyperlipidemia   . Medical history reviewed with no changes   . PONV (postoperative nausea and vomiting)   . Smoker    1 ppd plus x 40 plus years  . Thyroid disease    goiter- md is monitoring    Past Surgical History:  Procedure Laterality Date  . CHOLECYSTECTOMY    . DILATION AND CURETTAGE OF UTERUS    . GALLBLADDER SURGERY  2001  . THYROID LOBECTOMY Left 03/08/2018   Procedure: LEFT THYROID LOBECTOMY;  Surgeon: Armandina Gemma, MD;  Location: [facility];  Service: General;  Laterality: Left;  . THYROIDECTOMY N/A 03/26/2018   Procedure: COMPLETION THYROIDECTOMY;  Surgeon: Armandina Gemma, MD;  Location: [facility];  Service: General;  Laterality: N/A;    Social History   Socioeconomic History  . Marital status: Widowed    Spouse name: Not on file  . Number of children: Not on file  . Years of education: Not on file  . Highest education level: Not on file  Occupational History  . Not on file  Social Needs  . Financial resource strain: Not on file  . Food insecurity:    Worry: Not on file    Inability: Not on file   . Transportation needs:    Medical: Not on file    Non-medical: Not on file  Tobacco Use  . Smoking status: Current Every Day Smoker    Packs/day: 1.00    Years: 40.00    Pack years: 40.00    Types: Cigarettes  . Smokeless tobacco: Never Used  Substance and Sexual Activity  . Alcohol use: Yes    Comment: occasional  . Drug use: No  . Sexual activity: Yes    Birth control/protection: Post-menopausal  Lifestyle  . Physical activity:    Days per week: Not on file    Minutes per session: Not on file  . Stress: Not on file  Relationships  . Social connections:    Talks on phone: Not on file    Gets together: Not on file    Attends religious service: Not on file    Active member of club or organization: Not on file    Attends meetings of clubs or organizations: Not on file    Relationship status: Not on file  . Intimate partner violence:    Fear of current or ex partner: Not on file    Emotionally abused: Not on file    Physically abused: Not on file    Forced sexual activity: Not on file  Other  Topics Concern  . Not on file  Social History Narrative  . Not on file    Current Outpatient Medications on File Prior to Visit  Medication Sig Dispense Refill  . acyclovir ointment (ZOVIRAX) 5 % Apply thin layer to affected area twice daily as needed. 15 g 0  . ALPRAZolam (XANAX) 0.25 MG tablet Take 0.125 mg by mouth at bedtime.     Marland Kitchen atorvastatin (LIPITOR) 40 MG tablet Take 1 tablet (40 mg total) by mouth daily. (Patient taking differently: Take 40 mg by mouth at bedtime. ) 30 tablet 0  . calcium carbonate (TUMS) 500 MG chewable tablet Chew 2 tablets (400 mg of elemental calcium total) by mouth 2 (two) times daily. 90 tablet 1  . tetrahydrozoline (VISINE) 0.05 % ophthalmic solution Place 2 drops into both eyes daily as needed (for dry eyes).     Current Facility-Administered Medications on File Prior to Visit  Medication Dose Route Frequency Provider Last Rate Last Dose  . 0.9 %   sodium chloride infusion  500 mL Intravenous Continuous Doran Stabler, MD        Allergies  Allergen Reactions  . Amoxicillin Diarrhea and Nausea And Vomiting    Family History  Problem Relation Age of Onset  . Arthritis Mother   . Stroke Mother   . Esophageal cancer Maternal Uncle   . Colon cancer Neg Hx   . Rectal cancer Neg Hx   . Stomach cancer Neg Hx   . Thyroid disease Neg Hx     BP 110/68 (BP Location: Left Arm, Patient Position: Sitting, Cuff Size: Normal)   Pulse 80   Ht 5\' 7"  (1.702 m)   Wt 131 lb 6.4 oz (59.6 kg)   SpO2 97%   BMI 20.58 kg/m   Review of Systems Denies neck swelling and     Objective:   Physical Exam VITAL SIGNS:  See vs page GENERAL: no distress Neck: a healed scar is present.  I do not appreciate a nodule in the thyroid or elsewhere in the neck      Assessment & Plan:  HCTCa: we discussed.  She should have Radioactive iodine treatment Hypothyroidism: mild.  TSH needs to be higher for RAI On Tums.  Medicare won't pay for vit-D level since she did not have hypocalcemia, so I advised pt to take 1000 units/day.  She can have Ca++ checked with next cpx.  Patient Instructions  1. Blood tests are requested for you today.  We'll let you know about the results.  2. Please stop taking the levothyroxine.   3. When the thyroid is low enough, i'll request the radioactive iodine pill. 4. You should be by yourself for the day of the pill, and the next 3 days. 5. You would go back to x-ray a few days later, to lie in front of a camera. 6. You would resume the levothyroxine pill. 7. Please come back here 4-6 weeks later.

## 2018-05-07 NOTE — Patient Instructions (Signed)
1. Blood tests are requested for you today.  We'll let you know about the results.  2. Please stop taking the levothyroxine.   3. When the thyroid is low enough, i'll request the radioactive iodine pill. 4. You should be by yourself for the day of the pill, and the next 3 days. 5. You would go back to x-ray a few days later, to lie in front of a camera. 6. You would resume the levothyroxine pill. 7. Please come back here 4-6 weeks later.

## 2018-05-08 ENCOUNTER — Other Ambulatory Visit: Payer: Self-pay | Admitting: Endocrinology

## 2018-05-08 DIAGNOSIS — E209 Hypoparathyroidism, unspecified: Secondary | ICD-10-CM | POA: Insufficient documentation

## 2018-05-08 LAB — PTH, INTACT AND CALCIUM
CALCIUM: 10.3 mg/dL (ref 8.6–10.4)
PTH: 13 pg/mL — ABNORMAL LOW (ref 14–64)

## 2018-05-16 ENCOUNTER — Telehealth: Payer: Self-pay | Admitting: Endocrinology

## 2018-05-16 NOTE — Telephone Encounter (Signed)
please call [facility] scheduling>  Can they give Radioactive iodine treatment, for thyroid cancer?

## 2018-05-16 NOTE — Telephone Encounter (Signed)
Called pt--questioning if needed to postpone the lab since need to be done at the hospital and do she need to go back taking thyroid medication?

## 2018-05-16 NOTE — Telephone Encounter (Signed)
Patient called and stated that now that she has had her Thryoid removed what is her next step. Please call to advise her number [phone]

## 2018-05-16 NOTE — Telephone Encounter (Signed)
The last thyroid blood test was not low enough to have the radioactive iodine pill.  Please come in to have the blood taken again.

## 2018-05-16 NOTE — Telephone Encounter (Signed)
Please advise 

## 2018-05-17 ENCOUNTER — Telehealth: Payer: Self-pay | Admitting: Endocrinology

## 2018-05-17 MED ORDER — LEVOTHYROXINE SODIUM 112 MCG PO TABS: 112.0000 ug | ORAL_TABLET | Freq: Every day | ORAL | 0 refills | Status: DC

## 2018-05-17 NOTE — Telephone Encounter (Signed)
According to Jan at [facility] scheduling, they are not scheduling any appointments at this time,try back in May

## 2018-05-17 NOTE — Telephone Encounter (Signed)
OK, please resume levothyroxine at 112 mcg/d.  I have sent a prescription to your pharmacy.  Please come back for a follow-up appointment in 6 weeks.

## 2018-05-17 NOTE — Telephone Encounter (Signed)
Notified pt [facility] postpone scheduling until May and resume taking Rx levothyroxine 173mcg and  Pt have f/u appointment in 6 weeks.

## 2018-06-11 ENCOUNTER — Telehealth: Payer: Self-pay | Admitting: Endocrinology

## 2018-06-11 DIAGNOSIS — F419 Anxiety disorder, unspecified: Secondary | ICD-10-CM | POA: Diagnosis not present

## 2018-06-11 NOTE — Telephone Encounter (Signed)
Please advise 

## 2018-06-11 NOTE — Telephone Encounter (Signed)
Please sched in-person appt.

## 2018-06-11 NOTE — Telephone Encounter (Signed)
Pt has been rescheduled to tomorrow at 3:00pm

## 2018-06-11 NOTE — Telephone Encounter (Signed)
Patient is calling stating that every time she swallows she feels like she has something stuck in her throat. She would like to know if Dr.Ellison wants to see her sooner.  Please Advise, Thanks

## 2018-06-12 ENCOUNTER — Ambulatory Visit (INDEPENDENT_AMBULATORY_CARE_PROVIDER_SITE_OTHER): Payer: Medicare Other | Admitting: Endocrinology

## 2018-06-12 ENCOUNTER — Encounter: Payer: Self-pay | Admitting: Endocrinology

## 2018-06-12 ENCOUNTER — Other Ambulatory Visit: Payer: Self-pay

## 2018-06-12 VITALS — BP 120/68 | HR 85 | Ht 67.0 in | Wt 136.2 lb

## 2018-06-12 DIAGNOSIS — E890 Postprocedural hypothyroidism: Secondary | ICD-10-CM | POA: Diagnosis not present

## 2018-06-12 DIAGNOSIS — E209 Hypoparathyroidism, unspecified: Secondary | ICD-10-CM

## 2018-06-12 LAB — T4, FREE: Free T4: 1.02 ng/dL (ref 0.60–1.60)

## 2018-06-12 LAB — TSH: TSH: 8.64 u[IU]/mL — ABNORMAL HIGH (ref 0.35–4.50)

## 2018-06-12 LAB — VITAMIN D 25 HYDROXY (VIT D DEFICIENCY, FRACTURES): VITD: 29.27 ng/mL — ABNORMAL LOW (ref 30.00–100.00)

## 2018-06-12 NOTE — Progress Notes (Signed)
Subjective:    Patient ID: Monique Lopez, female    DOB: 07-Mar-1950, 68 y.o.   MRN: [id number]  HPI Pt returns for f/u of stage 2 HCTCa.   1/20: Thyroid, lobectomy, left and isthmus: HURTHLE CELL CARCINOMA, ENCAPSULATED; 2.4 CM; CONFINED TO THYROID; MARGINS ARE NEGATIVE; THERE IS ANGIOINVASION, but NO LYMPHATIC INVASION; 0/4 NODES.   2/20:Thyroid, lobectomy, right: BENIGN.    RAI is delayed, as radiol is not offering this service now. She has slight swelling of the right ant neck, but no assoc pain.   She no longer takes Ca++ or vit-D.  Past Medical History:  Diagnosis Date  . Allergy   . Anxiety   . Arthritis    hands, knees  . Complication of anesthesia   . Emphysema of lung ([place])   . GERD (gastroesophageal reflux disease)    diet controlled  . HSV infection   . Hyperlipidemia   . Medical history reviewed with no changes   . PONV (postoperative nausea and vomiting)   . Smoker    1 ppd plus x 40 plus years  . Thyroid disease    goiter- md is monitoring    Past Surgical History:  Procedure Laterality Date  . CHOLECYSTECTOMY    . DILATION AND CURETTAGE OF UTERUS    . GALLBLADDER SURGERY  2001  . THYROID LOBECTOMY Left 03/08/2018   Procedure: LEFT THYROID LOBECTOMY;  Surgeon: Armandina Gemma, MD;  Location: [facility];  Service: General;  Laterality: Left;  . THYROIDECTOMY N/A 03/26/2018   Procedure: COMPLETION THYROIDECTOMY;  Surgeon: Armandina Gemma, MD;  Location: [facility];  Service: General;  Laterality: N/A;    Social History   Socioeconomic History  . Marital status: Widowed    Spouse name: Not on file  . Number of children: Not on file  . Years of education: Not on file  . Highest education level: Not on file  Occupational History  . Not on file  Social Needs  . Financial resource strain: Not on file  . Food insecurity:    Worry: Not on file    Inability: Not on file  . Transportation needs:    Medical: Not on file    Non-medical: Not on file  Tobacco Use  .  Smoking status: Current Every Day Smoker    Packs/day: 1.00    Years: 40.00    Pack years: 40.00    Types: Cigarettes  . Smokeless tobacco: Never Used  Substance and Sexual Activity  . Alcohol use: Yes    Comment: occasional  . Drug use: No  . Sexual activity: Yes    Birth control/protection: Post-menopausal  Lifestyle  . Physical activity:    Days per week: Not on file    Minutes per session: Not on file  . Stress: Not on file  Relationships  . Social connections:    Talks on phone: Not on file    Gets together: Not on file    Attends religious service: Not on file    Active member of club or organization: Not on file    Attends meetings of clubs or organizations: Not on file    Relationship status: Not on file  . Intimate partner violence:    Fear of current or ex partner: Not on file    Emotionally abused: Not on file    Physically abused: Not on file    Forced sexual activity: Not on file  Other Topics Concern  . Not on file  Social  History Narrative  . Not on file    Current Outpatient Medications on File Prior to Visit  Medication Sig Dispense Refill  . acyclovir ointment (ZOVIRAX) 5 % Apply thin layer to affected area twice daily as needed. 15 g 0  . ALPRAZolam (XANAX) 0.25 MG tablet Take 0.125 mg by mouth at bedtime.     Marland Kitchen atorvastatin (LIPITOR) 40 MG tablet Take 1 tablet (40 mg total) by mouth daily. (Patient taking differently: Take 40 mg by mouth at bedtime. ) 30 tablet 0  . calcium carbonate (TUMS) 500 MG chewable tablet Chew 2 tablets (400 mg of elemental calcium total) by mouth 2 (two) times daily. 90 tablet 1  . tetrahydrozoline (VISINE) 0.05 % ophthalmic solution Place 2 drops into both eyes daily as needed (for dry eyes).     Current Facility-Administered Medications on File Prior to Visit  Medication Dose Route Frequency Provider Last Rate Last Dose  . 0.9 %  sodium chloride infusion  500 mL Intravenous Continuous Doran Stabler, MD         Allergies  Allergen Reactions  . Amoxicillin Diarrhea and Nausea And Vomiting    Family History  Problem Relation Age of Onset  . Arthritis Mother   . Stroke Mother   . Esophageal cancer Maternal Uncle   . Colon cancer Neg Hx   . Rectal cancer Neg Hx   . Stomach cancer Neg Hx   . Thyroid disease Neg Hx     BP 120/68 (BP Location: Left Arm, Patient Position: Sitting, Cuff Size: Normal)   Pulse 85   Ht 5\' 7"  (1.702 m)   Wt 136 lb 3.2 oz (61.8 kg)   SpO2 96%   BMI 21.33 kg/m   Review of Systems Denies numbness and muscle cramps.      Objective:   Physical Exam VITAL SIGNS:  See vs page GENERAL: no distress Neck: a healed scar is present.  I do not appreciate a nodule in the thyroid or elsewhere in the neck   Lab Results  Component Value Date   TSH 8.64 (H) 06/12/2018   Lab Results  Component Value Date   PTH 22 06/12/2018   CALCIUM 9.5 06/12/2018       Assessment & Plan:  Hypothyroidism: she needs increased rx. I have sent a prescription to your pharmacy.  Hypoparathyroidism, resolved.  No further dx or rx is needed for this.  Neck swelling: I can't confirm on PE.  I told pt we'll have to address this when imaging is available.  Please come back for a follow-up appointment in 2 months.

## 2018-06-12 NOTE — Patient Instructions (Addendum)
Blood tests are requested for you today.  We'll let you know about the results.  Please come back for a follow-up appointment in 2 months.   When we are ready for the radioactive iodine pill, it works like this: 1. Blood tests are requested for you.  We'll let you know about the results.  2. Please stop taking the levothyroxine.   3. When the thyroid is low enough, i'll request the radioactive iodine pill. 4. You should be by yourself for the day of the pill, and the next 3 days.  5. You would go back to x-ray a few days later, to lie in front of a camera. 6. You would resume the levothyroxine pill. 7. Please come back here 4-6 weeks later.

## 2018-06-13 LAB — PTH, INTACT AND CALCIUM
Calcium: 9.5 mg/dL (ref 8.6–10.4)
PTH: 22 pg/mL (ref 14–64)

## 2018-06-13 MED ORDER — LEVOTHYROXINE SODIUM 137 MCG PO TABS: 137.0000 ug | ORAL_TABLET | Freq: Every day | ORAL | 11 refills | Status: DC

## 2018-06-21 ENCOUNTER — Ambulatory Visit: Payer: Self-pay | Admitting: Endocrinology

## 2018-07-13 ENCOUNTER — Encounter: Payer: Self-pay | Admitting: Endocrinology

## 2018-07-17 ENCOUNTER — Ambulatory Visit (INDEPENDENT_AMBULATORY_CARE_PROVIDER_SITE_OTHER): Payer: Medicare Other | Admitting: Endocrinology

## 2018-07-17 DIAGNOSIS — C73 Malignant neoplasm of thyroid gland: Secondary | ICD-10-CM | POA: Diagnosis not present

## 2018-07-17 NOTE — Progress Notes (Signed)
Subjective:    Patient ID: Monique Lopez, female    DOB: [birth year], 68 y.o.   MRN: [id number]  HPI  telehealth visit today via phone x 5 minutes Alternatives to telehealth are presented to this patient, and the patient agrees to the telehealth visit.  Pt is advised of the cost of the visit, and agrees to this, also.   Patient is at home, and I am at the office.   Persons attending the telehealth visit: the patient and I Pt returns for f/u of stage 2 HCTCa.   1/20: Thyroid, lobectomy, left and isthmus: HURTHLE CELL CARCINOMA, ENCAPSULATED; 2.4 CM; CONFINED TO THYROID; MARGINS ARE NEGATIVE; THERE IS ANGIOINVASION, but NO LYMPHATIC INVASION; 0/4 NODES.   2/20:Thyroid, lobectomy, right: BENIGN.    RAI is delayed, as radiol is not offering this service now.  She denies neck swelling She takes vit-D, 1000 units/day.   Past Medical History:  Diagnosis Date  . Allergy   . Anxiety   . Arthritis    hands, knees  . Complication of anesthesia   . Emphysema of lung ([place])   . GERD (gastroesophageal reflux disease)    diet controlled  . HSV infection   . Hyperlipidemia   . Medical history reviewed with no changes   . PONV (postoperative nausea and vomiting)   . Smoker    1 ppd plus x 40 plus years  . Thyroid disease    goiter- md is monitoring    Past Surgical History:  Procedure Laterality Date  . CHOLECYSTECTOMY    . DILATION AND CURETTAGE OF UTERUS    . GALLBLADDER SURGERY  2001  . THYROID LOBECTOMY Left 03/08/2018   Procedure: LEFT THYROID LOBECTOMY;  Surgeon: Armandina Gemma, MD;  Location: [facility];  Service: General;  Laterality: Left;  . THYROIDECTOMY N/A 03/26/2018   Procedure: COMPLETION THYROIDECTOMY;  Surgeon: Armandina Gemma, MD;  Location: [facility];  Service: General;  Laterality: N/A;    Social History   Socioeconomic History  . Marital status: Widowed    Spouse name: Not on file  . Number of children: Not on file  . Years of education: Not on file  . Highest education  level: Not on file  Occupational History  . Not on file  Social Needs  . Financial resource strain: Not on file  . Food insecurity:    Worry: Not on file    Inability: Not on file  . Transportation needs:    Medical: Not on file    Non-medical: Not on file  Tobacco Use  . Smoking status: Current Every Day Smoker    Packs/day: 1.00    Years: 40.00    Pack years: 40.00    Types: Cigarettes  . Smokeless tobacco: Never Used  Substance and Sexual Activity  . Alcohol use: Yes    Comment: occasional  . Drug use: No  . Sexual activity: Yes    Birth control/protection: Post-menopausal  Lifestyle  . Physical activity:    Days per week: Not on file    Minutes per session: Not on file  . Stress: Not on file  Relationships  . Social connections:    Talks on phone: Not on file    Gets together: Not on file    Attends religious service: Not on file    Active member of club or organization: Not on file    Attends meetings of clubs or organizations: Not on file    Relationship status: Not on file  .  Intimate partner violence:    Fear of current or ex partner: Not on file    Emotionally abused: Not on file    Physically abused: Not on file    Forced sexual activity: Not on file  Other Topics Concern  . Not on file  Social History Narrative  . Not on file    Current Outpatient Medications on File Prior to Visit  Medication Sig Dispense Refill  . acyclovir ointment (ZOVIRAX) 5 % Apply thin layer to affected area twice daily as needed. 15 g 0  . ALPRAZolam (XANAX) 0.25 MG tablet Take 0.125 mg by mouth at bedtime.     Marland Kitchen atorvastatin (LIPITOR) 40 MG tablet Take 1 tablet (40 mg total) by mouth daily. (Patient taking differently: Take 40 mg by mouth at bedtime. ) 30 tablet 0  . calcium carbonate (TUMS) 500 MG chewable tablet Chew 2 tablets (400 mg of elemental calcium total) by mouth 2 (two) times daily. 90 tablet 1  . levothyroxine (SYNTHROID) 137 MCG tablet Take 1 tablet (137 mcg  total) by mouth daily before breakfast. 30 tablet 11  . tetrahydrozoline (VISINE) 0.05 % ophthalmic solution Place 2 drops into both eyes daily as needed (for dry eyes).     Current Facility-Administered Medications on File Prior to Visit  Medication Dose Route Frequency Provider Last Rate Last Dose  . 0.9 %  sodium chloride infusion  500 mL Intravenous Continuous Doran Stabler, MD        Allergies  Allergen Reactions  . Amoxicillin Diarrhea and Nausea And Vomiting    Family History  Problem Relation Age of Onset  . Arthritis Mother   . Stroke Mother   . Esophageal cancer Maternal Uncle   . Colon cancer Neg Hx   . Rectal cancer Neg Hx   . Stomach cancer Neg Hx   . Thyroid disease Neg Hx       Review of Systems Denies neck pain.      Objective:   Physical Exam      Assessment & Plan:  Postsurgical hypothyroidism: she declines labs now.   HCTCa: we discussed.  she wants to wait on RAI, too. Vit-D def: on rx  Patient Instructions  Please continue the same medications Please come back for a follow-up appointment in 2 months.  Then we'll plan to go ahead with the radioactive iodine pill.

## 2018-07-17 NOTE — Patient Instructions (Signed)
Please continue the same medications Please come back for a follow-up appointment in 2 months.  Then we'll plan to go ahead with the radioactive iodine pill.

## 2018-08-12 ENCOUNTER — Other Ambulatory Visit: Payer: Self-pay | Admitting: Endocrinology

## 2018-09-13 ENCOUNTER — Other Ambulatory Visit: Payer: Self-pay

## 2018-09-14 ENCOUNTER — Other Ambulatory Visit: Payer: Self-pay | Admitting: Endocrinology

## 2018-09-17 ENCOUNTER — Encounter: Payer: Self-pay | Admitting: Endocrinology

## 2018-09-17 ENCOUNTER — Ambulatory Visit (INDEPENDENT_AMBULATORY_CARE_PROVIDER_SITE_OTHER): Payer: Medicare Other | Admitting: Endocrinology

## 2018-09-17 ENCOUNTER — Other Ambulatory Visit: Payer: Self-pay

## 2018-09-17 VITALS — BP 112/78 | HR 94 | Temp 99.0°F | Ht 67.0 in | Wt 138.0 lb

## 2018-09-17 DIAGNOSIS — E890 Postprocedural hypothyroidism: Secondary | ICD-10-CM

## 2018-09-17 DIAGNOSIS — C73 Malignant neoplasm of thyroid gland: Secondary | ICD-10-CM

## 2018-09-17 DIAGNOSIS — E209 Hypoparathyroidism, unspecified: Secondary | ICD-10-CM

## 2018-09-17 LAB — TSH: TSH: 0.57 u[IU]/mL (ref 0.35–4.50)

## 2018-09-17 LAB — VITAMIN D 25 HYDROXY (VIT D DEFICIENCY, FRACTURES): VITD: 39.58 ng/mL (ref 30.00–100.00)

## 2018-09-17 NOTE — Patient Instructions (Addendum)
1.  Blood tests are requested for you today.  We'll let you know about the results.  2.  Please stop taking the levothyroxine 3.  Based on today's results, we'll probably have to repeat the thyroid blood test until it is low enough for the       Radioactive iodine treatment pill.  This may take 2-3 weeks.  Please continue to stay off the levothyroxine. 4.  When the thyroid is low enough, I'll order the treatment pill.  5.  Please then resume the levothyroxine. 6.  After the treatment pill, X-ray will ask you to go back a few days later, to lie down in front of a camera.   7.  Please come back here approx 1 month later.

## 2018-09-17 NOTE — Progress Notes (Signed)
Subjective:    Patient ID: Monique Lopez, female    DOB: 06-23-1950, 68 y.o.   MRN: [id number]  HPI  The state of at least three ongoing medical problems is addressed today, with interval history of each noted here: Pt returns for f/u of stage 2 HCTCa.   1/20: Thyroid, lobectomy, left and isthmus: HURTHLE CELL CARCINOMA, ENCAPSULATED; 2.4 CM; CONFINED TO THYROID; MARGINS ARE NEGATIVE; THERE IS ANGIOINVASION, but NO LYMPHATIC INVASION; 0/4 NODES.   2/20:Thyroid, lobectomy: pathol was BENIGN.   RAI was delayed, due to coronavirus.   She denies neck swelling.   Post-RAI hypothyroidism: she denies weight change.   She has not recently taken vit-D.  She denies muscle cramps.  Past Medical History:  Diagnosis Date  . Allergy   . Anxiety   . Arthritis    hands, knees  . Complication of anesthesia   . Emphysema of lung ([place])   . GERD (gastroesophageal reflux disease)    diet controlled  . HSV infection   . Hyperlipidemia   . Medical history reviewed with no changes   . PONV (postoperative nausea and vomiting)   . Smoker    1 ppd plus x 40 plus years  . Thyroid disease    goiter- md is monitoring    Past Surgical History:  Procedure Laterality Date  . CHOLECYSTECTOMY    . DILATION AND CURETTAGE OF UTERUS    . GALLBLADDER SURGERY  2001  . THYROID LOBECTOMY Left 03/08/2018   Procedure: LEFT THYROID LOBECTOMY;  Surgeon: Armandina Gemma, MD;  Location: [facility];  Service: General;  Laterality: Left;  . THYROIDECTOMY N/A 03/26/2018   Procedure: COMPLETION THYROIDECTOMY;  Surgeon: Armandina Gemma, MD;  Location: [facility];  Service: General;  Laterality: N/A;    Social History   Socioeconomic History  . Marital status: Widowed    Spouse name: Not on file  . Number of children: Not on file  . Years of education: Not on file  . Highest education level: Not on file  Occupational History  . Not on file  Social Needs  . Financial resource strain: Not on file  . Food insecurity    Worry: Not  on file    Inability: Not on file  . Transportation needs    Medical: Not on file    Non-medical: Not on file  Tobacco Use  . Smoking status: Current Every Day Smoker    Packs/day: 1.00    Years: 40.00    Pack years: 40.00    Types: Cigarettes  . Smokeless tobacco: Never Used  Substance and Sexual Activity  . Alcohol use: Yes    Comment: occasional  . Drug use: No  . Sexual activity: Yes    Birth control/protection: Post-menopausal  Lifestyle  . Physical activity    Days per week: Not on file    Minutes per session: Not on file  . Stress: Not on file  Relationships  . Social Herbalist on phone: Not on file    Gets together: Not on file    Attends religious service: Not on file    Active member of club or organization: Not on file    Attends meetings of clubs or organizations: Not on file    Relationship status: Not on file  . Intimate partner violence    Fear of current or ex partner: Not on file    Emotionally abused: Not on file    Physically abused: Not on file  Forced sexual activity: Not on file  Other Topics Concern  . Not on file  Social History Narrative  . Not on file    Current Outpatient Medications on File Prior to Visit  Medication Sig Dispense Refill  . ALPRAZolam (XANAX) 0.25 MG tablet Take 0.125 mg by mouth at bedtime.     Marland Kitchen atorvastatin (LIPITOR) 40 MG tablet Take 1 tablet (40 mg total) by mouth daily. (Patient taking differently: Take 40 mg by mouth at bedtime. ) 30 tablet 0  . calcium carbonate (TUMS) 500 MG chewable tablet Chew 2 tablets (400 mg of elemental calcium total) by mouth 2 (two) times daily. 90 tablet 1  . tetrahydrozoline (VISINE) 0.05 % ophthalmic solution Place 2 drops into both eyes daily as needed (for dry eyes).     Current Facility-Administered Medications on File Prior to Visit  Medication Dose Route Frequency Provider Last Rate Last Dose  . 0.9 %  sodium chloride infusion  500 mL Intravenous Continuous Doran Stabler, MD        Allergies  Allergen Reactions  . Amoxicillin Diarrhea and Nausea And Vomiting    Family History  Problem Relation Age of Onset  . Arthritis Mother   . Stroke Mother   . Esophageal cancer Maternal Uncle   . Colon cancer Neg Hx   . Rectal cancer Neg Hx   . Stomach cancer Neg Hx   . Thyroid disease Neg Hx     BP 112/78 (BP Location: Left Arm, Patient Position: Sitting, Cuff Size: Normal)   Pulse 94   Temp 99 F (37.2 C) (Oral)   Ht 5\' 7"  (1.702 m)   Wt 138 lb (62.6 kg)   SpO2 97%   BMI 21.61 kg/m    Review of Systems Denies neck pain and numbness    Objective:   Physical Exam VITAL SIGNS:  See vs page GENERAL: no distress Neck: a healed scar is present.  I do not appreciate a nodule in the thyroid or elsewhere in the neck      Assessment & Plan:  Postsurgical hypothyroidism: recheck today HCTCa: due to adjuvant RAI Vit-D def: recheck today  Patient Instructions  1.  Blood tests are requested for you today.  We'll let you know about the results.  2.  Please stop taking the levothyroxine 3.  Based on today's results, we'll probably have to repeat the thyroid blood test until it is low enough for the       Radioactive iodine treatment pill.  This may take 2-3 weeks.  Please continue to stay off the levothyroxine. 4.  When the thyroid is low enough, I'll order the treatment pill.  5.  Please then resume the levothyroxine. 6.  After the treatment pill, X-ray will ask you to go back a few days later, to lie down in front of a camera.   7.  Please come back here approx 1 month later.

## 2018-09-18 LAB — PTH, INTACT AND CALCIUM
Calcium: 9.8 mg/dL (ref 8.6–10.4)
PTH: 22 pg/mL (ref 14–64)

## 2018-10-02 ENCOUNTER — Other Ambulatory Visit: Payer: Medicare Other

## 2018-10-02 DIAGNOSIS — E209 Hypoparathyroidism, unspecified: Secondary | ICD-10-CM | POA: Diagnosis not present

## 2018-10-02 DIAGNOSIS — E890 Postprocedural hypothyroidism: Secondary | ICD-10-CM | POA: Diagnosis not present

## 2018-10-02 NOTE — Addendum Note (Signed)
Addended by: Kaylyn Lim I on: 10/02/2018 08:46 AM   Modules accepted: Orders

## 2018-10-02 NOTE — Addendum Note (Signed)
Addended by: Kaylyn Lim I on: 10/02/2018 08:59 AM   Modules accepted: Orders

## 2018-10-02 NOTE — Addendum Note (Signed)
Addended by: Kaylyn Lim I on: 10/02/2018 08:47 AM   Modules accepted: Orders

## 2018-10-03 ENCOUNTER — Other Ambulatory Visit: Payer: Self-pay | Admitting: Endocrinology

## 2018-10-03 DIAGNOSIS — C73 Malignant neoplasm of thyroid gland: Secondary | ICD-10-CM

## 2018-10-03 LAB — VITAMIN D 25 HYDROXY (VIT D DEFICIENCY, FRACTURES): Vit D, 25-Hydroxy: 26.3 ng/mL — ABNORMAL LOW (ref 30.0–100.0)

## 2018-10-03 LAB — TSH: TSH: 31.2 u[IU]/mL — ABNORMAL HIGH (ref 0.450–4.500)

## 2018-10-09 DIAGNOSIS — J019 Acute sinusitis, unspecified: Secondary | ICD-10-CM | POA: Diagnosis not present

## 2018-10-11 ENCOUNTER — Telehealth: Payer: Self-pay | Admitting: Endocrinology

## 2018-10-11 NOTE — Telephone Encounter (Signed)
Dr. Loanne Drilling - Please advise  Col[place] - please advise about the status of iodine test

## 2018-10-11 NOTE — Telephone Encounter (Signed)
Please schedule RAI.  Thank you

## 2018-10-11 NOTE — Telephone Encounter (Signed)
Patient called to advise that she has not received a call regarding her Iodine Test. She wants to know when that will be scheduled and what she should do about her medication until then.

## 2018-10-15 NOTE — Telephone Encounter (Signed)
Please follow up with pt re: scheduling status of RAI

## 2018-10-15 NOTE — Telephone Encounter (Signed)
Patient has back about her RAI test.  She got a auto-reminder about her appointment this coming Thursday, but has yet to receive a call about the RAI. When is she going to get a call about this and should she keep this appointment with Dr Loanne Drilling?  I just cancelled and let her know that since is was to be post RAI that is was not necessary and that once she was scheduled for her RAI to call us and schedule her 6 week post RAI follow up

## 2018-10-15 NOTE — Telephone Encounter (Signed)
LMTCB

## 2018-10-18 ENCOUNTER — Ambulatory Visit: Payer: Medicare Other | Admitting: Endocrinology

## 2018-10-30 ENCOUNTER — Telehealth: Payer: Self-pay | Admitting: Endocrinology

## 2018-10-30 NOTE — Telephone Encounter (Signed)
Patient ph# [phone] called re: Patient was taken off Levothyroxine on September 17, 2018 in order to get an iodine pill. Patient is still waiting for the iodine pill. No one has called patient to discuss/schedule appointment for patient to get the iodine pill. Please call patient at the ph# listed above to advise. Patient does not feel right and her blood pressure is 131/90. Patient is about ready to go back on the thyroid medication.

## 2018-10-31 NOTE — Telephone Encounter (Signed)
Has this PA been completed or was one needed? If not needed, I cant get the pt scheduled.

## 2018-11-02 NOTE — Telephone Encounter (Signed)
Referred to office administrator.

## 2018-11-02 NOTE — Telephone Encounter (Signed)
Patient has called again stating she is not sure what to do. She is feeling she is needing to cancel and get back on her thyroid medicine. Blood press is still running high.  Patient would like to know how much longer being off her medicine will start to effect her.   Please Advise with Patient, Thanks

## 2018-11-09 NOTE — Telephone Encounter (Signed)
09/11 ATC [place] at [facility] to move the process along, she was not there at the time, lm  Currently at front office so forwarding to [place] to see if she can get in touch with [name]

## 2018-11-12 ENCOUNTER — Other Ambulatory Visit: Payer: Self-pay | Admitting: Endocrinology

## 2018-11-16 NOTE — Telephone Encounter (Signed)
Can you please update Dr. Loanne Drilling?

## 2018-11-16 NOTE — Telephone Encounter (Signed)
Spoke with patients daughter today in regards to message below. Patient never received a call back in regards to her symptoms or an appointment with [facility]. Daughter states that she is getting very weak, no energy, voice is changing, and blood pressure is still elevated. Patients daughter is stating the patient has been out of work this entire time and would like her to have this procedure done soon.  Please Advise, Thanks

## 2018-11-16 NOTE — Telephone Encounter (Signed)
Please review concern. Uncertain if her concern is related to her thyroid. Do you want to change the order to STAT? Or do you want her to resume her medication? Follow up with PCP?

## 2018-11-16 NOTE — Telephone Encounter (Signed)
I just need to know what is the holdup is?

## 2018-11-22 ENCOUNTER — Emergency Department (HOSPITAL_COMMUNITY)
Admission: EM | Admit: 2018-11-22 | Discharge: 2018-11-22 | Disposition: A | Discharge status: Home / Self Care | Payer: Medicare Other | Attending: Emergency Medicine | Admitting: Emergency Medicine

## 2018-11-22 ENCOUNTER — Encounter (HOSPITAL_COMMUNITY): Payer: Self-pay | Admitting: Emergency Medicine

## 2018-11-22 ENCOUNTER — Other Ambulatory Visit: Payer: Self-pay

## 2018-11-22 DIAGNOSIS — R531 Weakness: Secondary | ICD-10-CM | POA: Diagnosis not present

## 2018-11-22 DIAGNOSIS — R030 Elevated blood-pressure reading, without diagnosis of hypertension: Secondary | ICD-10-CM | POA: Diagnosis present

## 2018-11-22 DIAGNOSIS — I10 Essential (primary) hypertension: Secondary | ICD-10-CM | POA: Diagnosis not present

## 2018-11-22 DIAGNOSIS — F17210 Nicotine dependence, cigarettes, uncomplicated: Secondary | ICD-10-CM | POA: Diagnosis not present

## 2018-11-22 LAB — COMPREHENSIVE METABOLIC PANEL
ALT: 47 U/L — ABNORMAL HIGH (ref 0–44)
AST: 34 U/L (ref 15–41)
Albumin: 4.9 g/dL (ref 3.5–5.0)
Alkaline Phosphatase: 72 U/L (ref 38–126)
Anion gap: 11 (ref 5–15)
BUN: 15 mg/dL (ref 8–23)
CO2: 28 mmol/L (ref 22–32)
Calcium: 9.6 mg/dL (ref 8.9–10.3)
Chloride: 102 mmol/L (ref 98–111)
Creatinine, Ser: 0.97 mg/dL (ref 0.44–1.00)
GFR calc Af Amer: >60 mL/min (ref >60)
GFR calc non Af Amer: >60 mL/min (ref >60)
Glucose, Bld: 103 mg/dL — ABNORMAL HIGH (ref 70–99)
Potassium: 4 mmol/L (ref 3.5–5.1)
Sodium: 141 mmol/L (ref 135–145)
Total Bilirubin: 0.3 mg/dL (ref 0.3–1.2)
Total Protein: 8 g/dL (ref 6.5–8.1)

## 2018-11-22 LAB — CBC
HCT: 47.5 % — ABNORMAL HIGH (ref 36.0–46.0)
Hemoglobin: 15 g/dL (ref 12.0–15.0)
MCH: 29.4 pg (ref 26.0–34.0)
MCHC: 31.6 g/dL (ref 30.0–36.0)
MCV: 93 fL (ref 80.0–100.0)
Platelets: 235 10*3/uL (ref 150–400)
RBC: 5.11 MIL/uL (ref 3.87–5.11)
RDW: 14.8 % (ref 11.5–15.5)
WBC: 7.3 10*3/uL (ref 4.0–10.5)
nRBC: 0 % (ref 0.0–0.2)

## 2018-11-22 LAB — URINALYSIS, ROUTINE W REFLEX MICROSCOPIC
Bilirubin Urine: NEGATIVE
Glucose, UA: NEGATIVE mg/dL
Ketones, ur: NEGATIVE mg/dL
Nitrite: NEGATIVE
Protein, ur: NEGATIVE mg/dL
Specific Gravity, Urine: 1.01 (ref 1.005–1.030)
pH: 6 (ref 5.0–8.0)

## 2018-11-22 NOTE — Discharge Instructions (Addendum)
You were evaluated in the Emergency Department and after careful evaluation, we did not find any emergent condition requiring admission or further testing in the hospital.  Your exam/testing today is overall reassuring.  As discussed, please follow-up with your primary care doctor to discuss your high blood pressure as well as your thyroid medication.  Please return to the Emergency Department if you experience any worsening of your condition.  We encourage you to follow up with a primary care provider.  Thank you for allowing [place] to be a part of your care.

## 2018-11-22 NOTE — ED Triage Notes (Signed)
Pt c/o of her BP being elevated,  Denies any s/s  Pt also states her doctor took her off her tyroid meds 10 weeks ago

## 2018-11-22 NOTE — ED Provider Notes (Signed)
[facility] Emergency Department Provider Note MRN:  [id number]  Arrival date & time: 11/22/18     Chief Complaint   Hypertension   History of Present Illness   Monique Lopez is a 68 y.o. year-old female with a history of GERD presenting to the ED with chief complaint of hypertension.  Patient explains that she was taken off of her thyroid medicine a few weeks ago.  She has felt general malaise and weakness for 1 to 2 weeks.  Denies fever.  No cough.  No chest pain or shortness of breath, no abdominal pain.  No numbness or weakness to the arms or legs, no headache.  Was worried about her high blood pressure today and came in for evaluation.  Review of Systems  A complete 10 system review of systems was obtained and all systems are negative except as noted in the HPI and PMH.   Patient's Health History    Past Medical History:  Diagnosis Date  . Allergy   . Anxiety   . Arthritis    hands, knees  . Complication of anesthesia   . Emphysema of lung ([place])   . GERD (gastroesophageal reflux disease)    diet controlled  . HSV infection   . Hyperlipidemia   . Medical history reviewed with no changes   . PONV (postoperative nausea and vomiting)   . Smoker    1 ppd plus x 40 plus years  . Thyroid disease    goiter- md is monitoring    Past Surgical History:  Procedure Laterality Date  . CHOLECYSTECTOMY    . DILATION AND CURETTAGE OF UTERUS    . GALLBLADDER SURGERY  2001  . THYROID LOBECTOMY Left 03/08/2018   Procedure: LEFT THYROID LOBECTOMY;  Surgeon: Armandina Gemma, MD;  Location: [facility];  Service: General;  Laterality: Left;  . THYROIDECTOMY N/A 03/26/2018   Procedure: COMPLETION THYROIDECTOMY;  Surgeon: Armandina Gemma, MD;  Location: [facility];  Service: General;  Laterality: N/A;    Family History  Problem Relation Age of Onset  . Arthritis Mother   . Stroke Mother   . Esophageal cancer Maternal Uncle   . Colon cancer Neg Hx   . Rectal cancer Neg Hx    . Stomach cancer Neg Hx   . Thyroid disease Neg Hx     Social History   Socioeconomic History  . Marital status: Widowed    Spouse name: Not on file  . Number of children: Not on file  . Years of education: Not on file  . Highest education level: Not on file  Occupational History  . Not on file  Social Needs  . Financial resource strain: Not on file  . Food insecurity    Worry: Not on file    Inability: Not on file  . Transportation needs    Medical: Not on file    Non-medical: Not on file  Tobacco Use  . Smoking status: Current Every Day Smoker    Packs/day: 1.00    Years: 40.00    Pack years: 40.00    Types: Cigarettes  . Smokeless tobacco: Never Used  Substance and Sexual Activity  . Alcohol use: Yes    Comment: occasional  . Drug use: No  . Sexual activity: Yes    Birth control/protection: Post-menopausal  Lifestyle  . Physical activity    Days per week: Not on file    Minutes per session: Not on file  . Stress: Not on file  Relationships  . Social Herbalist on phone: Not on file    Gets together: Not on file    Attends religious service: Not on file    Active member of club or organization: Not on file    Attends meetings of clubs or organizations: Not on file    Relationship status: Not on file  . Intimate partner violence    Fear of current or ex partner: Not on file    Emotionally abused: Not on file    Physically abused: Not on file    Forced sexual activity: Not on file  Other Topics Concern  . Not on file  Social History Narrative  . Not on file     Physical Exam  Vital Signs and Nursing Notes reviewed Vitals:   11/22/18 1804 11/22/18 2041  BP: (!) 177/92 (!) 180/95  Pulse: 70 80  Resp: 18   Temp: 97.9 F (36.6 C)   SpO2: 99% 100%    CONSTITUTIONAL: Well-appearing, NAD NEURO:  Alert and oriented x 3, no focal deficits EYES:  eyes equal and reactive ENT/NECK:  no LAD, no JVD CARDIO: Regular rate, well-perfused, normal S1  and S2 PULM:  CTAB no wheezing or rhonchi GI/GU:  normal bowel sounds, non-distended, non-tender MSK/SPINE:  No gross deformities, no edema SKIN:  no rash, atraumatic PSYCH:  Appropriate speech and behavior  Diagnostic and Interventional Summary    EKG Interpretation  Date/Time:    Ventricular Rate:    PR Interval:    QRS Duration:   QT Interval:    QTC Calculation:   R Axis:     Text Interpretation:        Labs Reviewed  CBC - Abnormal; Notable for the following components:      Result Value   HCT 47.5 (*)    All other components within normal limits  COMPREHENSIVE METABOLIC PANEL - Abnormal; Notable for the following components:   Glucose, Bld 103 (*)    ALT 47 (*)    All other components within normal limits  URINALYSIS, ROUTINE W REFLEX MICROSCOPIC - Abnormal; Notable for the following components:   Hgb urine dipstick SMALL (*)    Leukocytes,Ua TRACE (*)    Bacteria, UA RARE (*)    All other components within normal limits    No orders to display    Medications - No data to display   Procedures Critical Care  ED Course and Medical Decision Making  I have reviewed the triage vital signs and the nursing notes.  Pertinent labs & imaging results that were available during my care of the patient were reviewed by me and considered in my medical decision making (see below for details).  Will evaluate for metabolic disarray, symptomatic anemia, urinary tract infection, will check TSH level.  Anticipating discharge.  This is largely asymptomatic hypertension with a normal neurological exam, no chest pain.  Labs reassuring, TSH level will unfortunately take many hours in this emergency department.  This was explained to patient, who is comfortable following up with primary care doctor for obtaining this lab value.  This is especially reasonable given that even with an abnormal level, I would not be prescribing or changing her levo thyroxine medication.  Appropriate for  discharge.  Barth Kirks. Sedonia Small, MD [place] [email or web address]  Final Clinical Impressions(s) / ED Diagnoses     ICD-10-CM   1. Weakness  R53.1   2. Hypertension, unspecified type  I10  ED Discharge Orders    None      Discharge Instructions Discussed with and Provided to Patient:   Discharge Instructions     You were evaluated in the Emergency Department and after careful evaluation, we did not find any emergent condition requiring admission or further testing in the hospital.  Your exam/testing today is overall reassuring.  As discussed, please follow-up with your primary care doctor to discuss your high blood pressure as well as your thyroid medication.  Please return to the Emergency Department if you experience any worsening of your condition.  We encourage you to follow up with a primary care provider.  Thank you for allowing [place] to be a part of your care.       Maudie Flakes, MD 11/22/18 2237

## 2018-11-23 ENCOUNTER — Encounter: Payer: Self-pay | Admitting: Endocrinology

## 2018-11-23 ENCOUNTER — Ambulatory Visit (INDEPENDENT_AMBULATORY_CARE_PROVIDER_SITE_OTHER): Payer: Medicare Other | Admitting: Endocrinology

## 2018-11-23 ENCOUNTER — Other Ambulatory Visit: Payer: Self-pay

## 2018-11-23 DIAGNOSIS — R030 Elevated blood-pressure reading, without diagnosis of hypertension: Secondary | ICD-10-CM | POA: Diagnosis not present

## 2018-11-23 DIAGNOSIS — F419 Anxiety disorder, unspecified: Secondary | ICD-10-CM | POA: Diagnosis not present

## 2018-11-23 DIAGNOSIS — R7401 Elevation of levels of liver transaminase levels: Secondary | ICD-10-CM | POA: Diagnosis not present

## 2018-11-23 DIAGNOSIS — E039 Hypothyroidism, unspecified: Secondary | ICD-10-CM | POA: Diagnosis not present

## 2018-11-23 DIAGNOSIS — C73 Malignant neoplasm of thyroid gland: Secondary | ICD-10-CM

## 2018-11-23 DIAGNOSIS — Z23 Encounter for immunization: Secondary | ICD-10-CM

## 2018-11-23 DIAGNOSIS — F329 Major depressive disorder, single episode, unspecified: Secondary | ICD-10-CM | POA: Diagnosis not present

## 2018-11-23 MED ORDER — LEVOTHYROXINE SODIUM 137 MCG PO TABS: 137.0000 ug | ORAL_TABLET | Freq: Every day | ORAL | 3 refills | Status: DC

## 2018-11-23 NOTE — Progress Notes (Signed)
Subjective:    Patient ID: Monique Lopez, female    DOB: 09/21/1950, 68 y.o.   MRN: [id number]  HPI Pt returns for f/u of stage 2 HCTCa.   1/20: Thyroid, lobectomy, left and isthmus: HURTHLE CELL CARCINOMA, ENCAPSULATED; 2.4 CM; CONFINED TO THYROID; MARGINS ARE NEGATIVE; THERE IS ANGIOINVASION, but NO LYMPHATIC INVASION; 0/4 NODES.   2/20:Thyroid, lobectomy: pathol was BENIGN.   RAI was delayed, due to coronavirus.   She denies neck swelling.   Post-RAI hypothyroidism: she has moderate hoarseness sensation in the throat, but no assoc neck swelling.   Past Medical History:  Diagnosis Date  . Allergy   . Anxiety   . Arthritis    hands, knees  . Complication of anesthesia   . Emphysema of lung ([place])   . GERD (gastroesophageal reflux disease)    diet controlled  . HSV infection   . Hyperlipidemia   . Medical history reviewed with no changes   . PONV (postoperative nausea and vomiting)   . Smoker    1 ppd plus x 40 plus years  . Thyroid disease    goiter- md is monitoring    Past Surgical History:  Procedure Laterality Date  . CHOLECYSTECTOMY    . DILATION AND CURETTAGE OF UTERUS    . GALLBLADDER SURGERY  2001  . THYROID LOBECTOMY Left 03/08/2018   Procedure: LEFT THYROID LOBECTOMY;  Surgeon: Armandina Gemma, MD;  Location: [facility];  Service: General;  Laterality: Left;  . THYROIDECTOMY N/A 03/26/2018   Procedure: COMPLETION THYROIDECTOMY;  Surgeon: Armandina Gemma, MD;  Location: [facility];  Service: General;  Laterality: N/A;    Social History   Socioeconomic History  . Marital status: Widowed    Spouse name: Not on file  . Number of children: Not on file  . Years of education: Not on file  . Highest education level: Not on file  Occupational History  . Not on file  Social Needs  . Financial resource strain: Not on file  . Food insecurity    Worry: Not on file    Inability: Not on file  . Transportation needs    Medical: Not on file    Non-medical: Not on file  Tobacco  Use  . Smoking status: Current Every Day Smoker    Packs/day: 1.00    Years: 40.00    Pack years: 40.00    Types: Cigarettes  . Smokeless tobacco: Never Used  Substance and Sexual Activity  . Alcohol use: Yes    Comment: occasional  . Drug use: No  . Sexual activity: Yes    Birth control/protection: Post-menopausal  Lifestyle  . Physical activity    Days per week: Not on file    Minutes per session: Not on file  . Stress: Not on file  Relationships  . Social Herbalist on phone: Not on file    Gets together: Not on file    Attends religious service: Not on file    Active member of club or organization: Not on file    Attends meetings of clubs or organizations: Not on file    Relationship status: Not on file  . Intimate partner violence    Fear of current or ex partner: Not on file    Emotionally abused: Not on file    Physically abused: Not on file    Forced sexual activity: Not on file  Other Topics Concern  . Not on file  Social History Narrative  .  Not on file    Current Outpatient Medications on File Prior to Visit  Medication Sig Dispense Refill  . ALPRAZolam (XANAX) 0.25 MG tablet Take 0.125 mg by mouth at bedtime.     Marland Kitchen atorvastatin (LIPITOR) 40 MG tablet Take 1 tablet (40 mg total) by mouth daily. (Patient taking differently: Take 40 mg by mouth at bedtime. ) 30 tablet 0  . calcium carbonate (TUMS) 500 MG chewable tablet Chew 2 tablets (400 mg of elemental calcium total) by mouth 2 (two) times daily. 90 tablet 1  . tetrahydrozoline (VISINE) 0.05 % ophthalmic solution Place 2 drops into both eyes daily as needed (for dry eyes).     Current Facility-Administered Medications on File Prior to Visit  Medication Dose Route Frequency Provider Last Rate Last Dose  . 0.9 %  sodium chloride infusion  500 mL Intravenous Continuous Doran Stabler, MD        Allergies  Allergen Reactions  . Amoxicillin Diarrhea and Nausea And Vomiting    Family History   Problem Relation Age of Onset  . Arthritis Mother   . Stroke Mother   . Esophageal cancer Maternal Uncle   . Colon cancer Neg Hx   . Rectal cancer Neg Hx   . Stomach cancer Neg Hx   . Thyroid disease Neg Hx     BP (!) 142/80   Pulse 75   Ht 5\' 7"  (1.702 m)   Wt 144 lb (65.3 kg)   SpO2 96%   BMI 22.55 kg/m    Review of Systems Denies neck pain, but she has generalized weakness.      Objective:   Physical Exam VITAL SIGNS:  See vs page GENERAL: no distress ENT: voice is hoarse.   Neck: a healed scar is present.  I do not appreciate a nodule in the thyroid or elsewhere in the neck.    Lab Results  Component Value Date   ALT 47 (H) 11/22/2018   AST 34 11/22/2018   ALKPHOS 72 11/22/2018   BILITOT 0.3 11/22/2018       Assessment & Plan:  HCTCa: we discussed.  pt wants to hold off on RAI for now.  I told her she can reconsider in the future.   HTN: is noted today Hypothyroidism: pt says she is not tolerating this well.  She wants to resume synthroid.  Elevated transaminase: I told pt this needs f/u.    Patient Instructions  please resume the levothyroxine.  Please come back for a follow-up appointment in 2 months.  Please see Dr Armandina Gemma about your liver blood test, and the blood pressure.

## 2018-11-23 NOTE — Patient Instructions (Addendum)
please resume the levothyroxine.  Please come back for a follow-up appointment in 2 months.  Please see Dr Armandina Gemma about your liver blood test, and the blood pressure.

## 2018-11-26 ENCOUNTER — Ambulatory Visit: Payer: Medicare Other | Admitting: Endocrinology

## 2018-11-26 NOTE — Telephone Encounter (Signed)
FYI

## 2018-11-26 NOTE — Telephone Encounter (Signed)
This procedure did not need Monique Lopez, [name] is aware of the need to schedule. After discussion last week-pt informed me that she did not want to do this test at the time.  Pt could call to set this up on her own when needed

## 2018-11-27 DIAGNOSIS — E890 Postprocedural hypothyroidism: Secondary | ICD-10-CM | POA: Diagnosis not present

## 2018-11-27 DIAGNOSIS — Z6822 Body mass index (BMI) 22.0-22.9, adult: Secondary | ICD-10-CM | POA: Diagnosis not present

## 2018-11-27 DIAGNOSIS — I10 Essential (primary) hypertension: Secondary | ICD-10-CM | POA: Diagnosis not present

## 2018-11-27 DIAGNOSIS — E7849 Other hyperlipidemia: Secondary | ICD-10-CM | POA: Diagnosis not present

## 2018-11-27 DIAGNOSIS — Z1389 Encounter for screening for other disorder: Secondary | ICD-10-CM | POA: Diagnosis not present

## 2018-12-06 DIAGNOSIS — J019 Acute sinusitis, unspecified: Secondary | ICD-10-CM | POA: Diagnosis not present

## 2018-12-11 DIAGNOSIS — R55 Syncope and collapse: Secondary | ICD-10-CM | POA: Diagnosis not present

## 2018-12-11 DIAGNOSIS — Z681 Body mass index (BMI) 19 or less, adult: Secondary | ICD-10-CM | POA: Diagnosis not present

## 2018-12-11 DIAGNOSIS — R5383 Other fatigue: Secondary | ICD-10-CM | POA: Diagnosis not present

## 2019-01-09 DIAGNOSIS — E039 Hypothyroidism, unspecified: Secondary | ICD-10-CM | POA: Diagnosis not present

## 2019-01-09 DIAGNOSIS — Z719 Counseling, unspecified: Secondary | ICD-10-CM | POA: Diagnosis not present

## 2019-01-09 DIAGNOSIS — Z1389 Encounter for screening for other disorder: Secondary | ICD-10-CM | POA: Diagnosis not present

## 2019-01-09 DIAGNOSIS — Z0001 Encounter for general adult medical examination with abnormal findings: Secondary | ICD-10-CM | POA: Diagnosis not present

## 2019-01-09 DIAGNOSIS — Z6821 Body mass index (BMI) 21.0-21.9, adult: Secondary | ICD-10-CM | POA: Diagnosis not present

## 2019-01-09 DIAGNOSIS — Z23 Encounter for immunization: Secondary | ICD-10-CM | POA: Diagnosis not present

## 2019-01-25 ENCOUNTER — Ambulatory Visit: Payer: Medicare Other | Admitting: Endocrinology

## 2019-04-24 DIAGNOSIS — Z23 Encounter for immunization: Secondary | ICD-10-CM | POA: Diagnosis not present

## 2019-05-27 DIAGNOSIS — Z23 Encounter for immunization: Secondary | ICD-10-CM | POA: Diagnosis not present

## 2019-06-18 DIAGNOSIS — E890 Postprocedural hypothyroidism: Secondary | ICD-10-CM | POA: Diagnosis not present

## 2019-06-18 DIAGNOSIS — E7849 Other hyperlipidemia: Secondary | ICD-10-CM | POA: Diagnosis not present

## 2019-06-18 DIAGNOSIS — Z1389 Encounter for screening for other disorder: Secondary | ICD-10-CM | POA: Diagnosis not present

## 2019-06-18 DIAGNOSIS — F419 Anxiety disorder, unspecified: Secondary | ICD-10-CM | POA: Diagnosis not present

## 2019-06-18 DIAGNOSIS — Z6820 Body mass index (BMI) 20.0-20.9, adult: Secondary | ICD-10-CM | POA: Diagnosis not present

## 2019-06-18 DIAGNOSIS — I10 Essential (primary) hypertension: Secondary | ICD-10-CM | POA: Diagnosis not present

## 2019-07-26 DIAGNOSIS — Z6820 Body mass index (BMI) 20.0-20.9, adult: Secondary | ICD-10-CM | POA: Diagnosis not present

## 2019-07-26 DIAGNOSIS — B029 Zoster without complications: Secondary | ICD-10-CM | POA: Diagnosis not present

## 2019-07-26 DIAGNOSIS — M545 Low back pain: Secondary | ICD-10-CM | POA: Diagnosis not present

## 2019-11-13 IMAGING — DX DG CHEST 2V
2 series · 2 of 2 positions shown · non-contrast
Comparison: 03/17/2015

CLINICAL DATA: Preop for thyroidectomy.

EXAM:
CHEST - 2 VIEW

[chest pa]
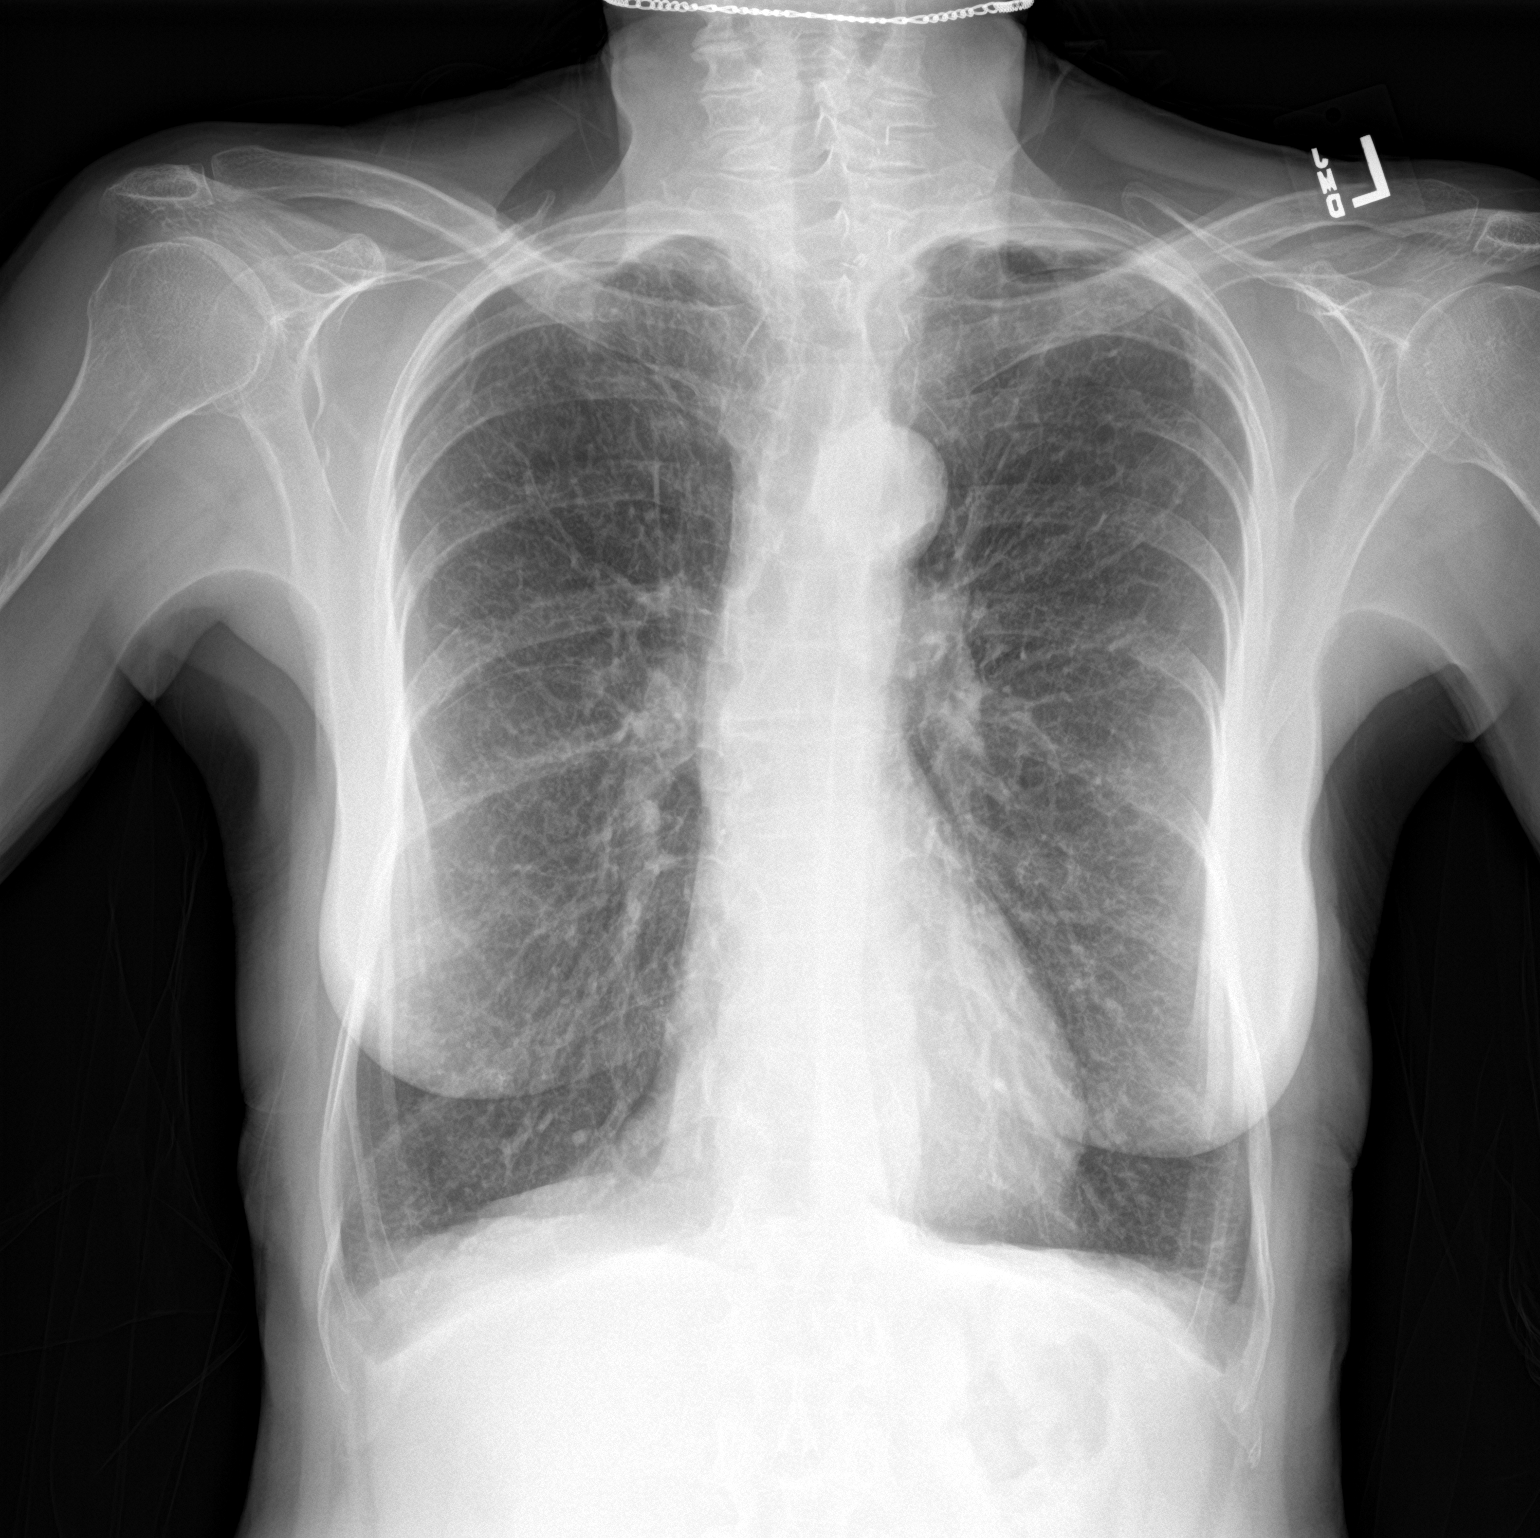

[chest lat]
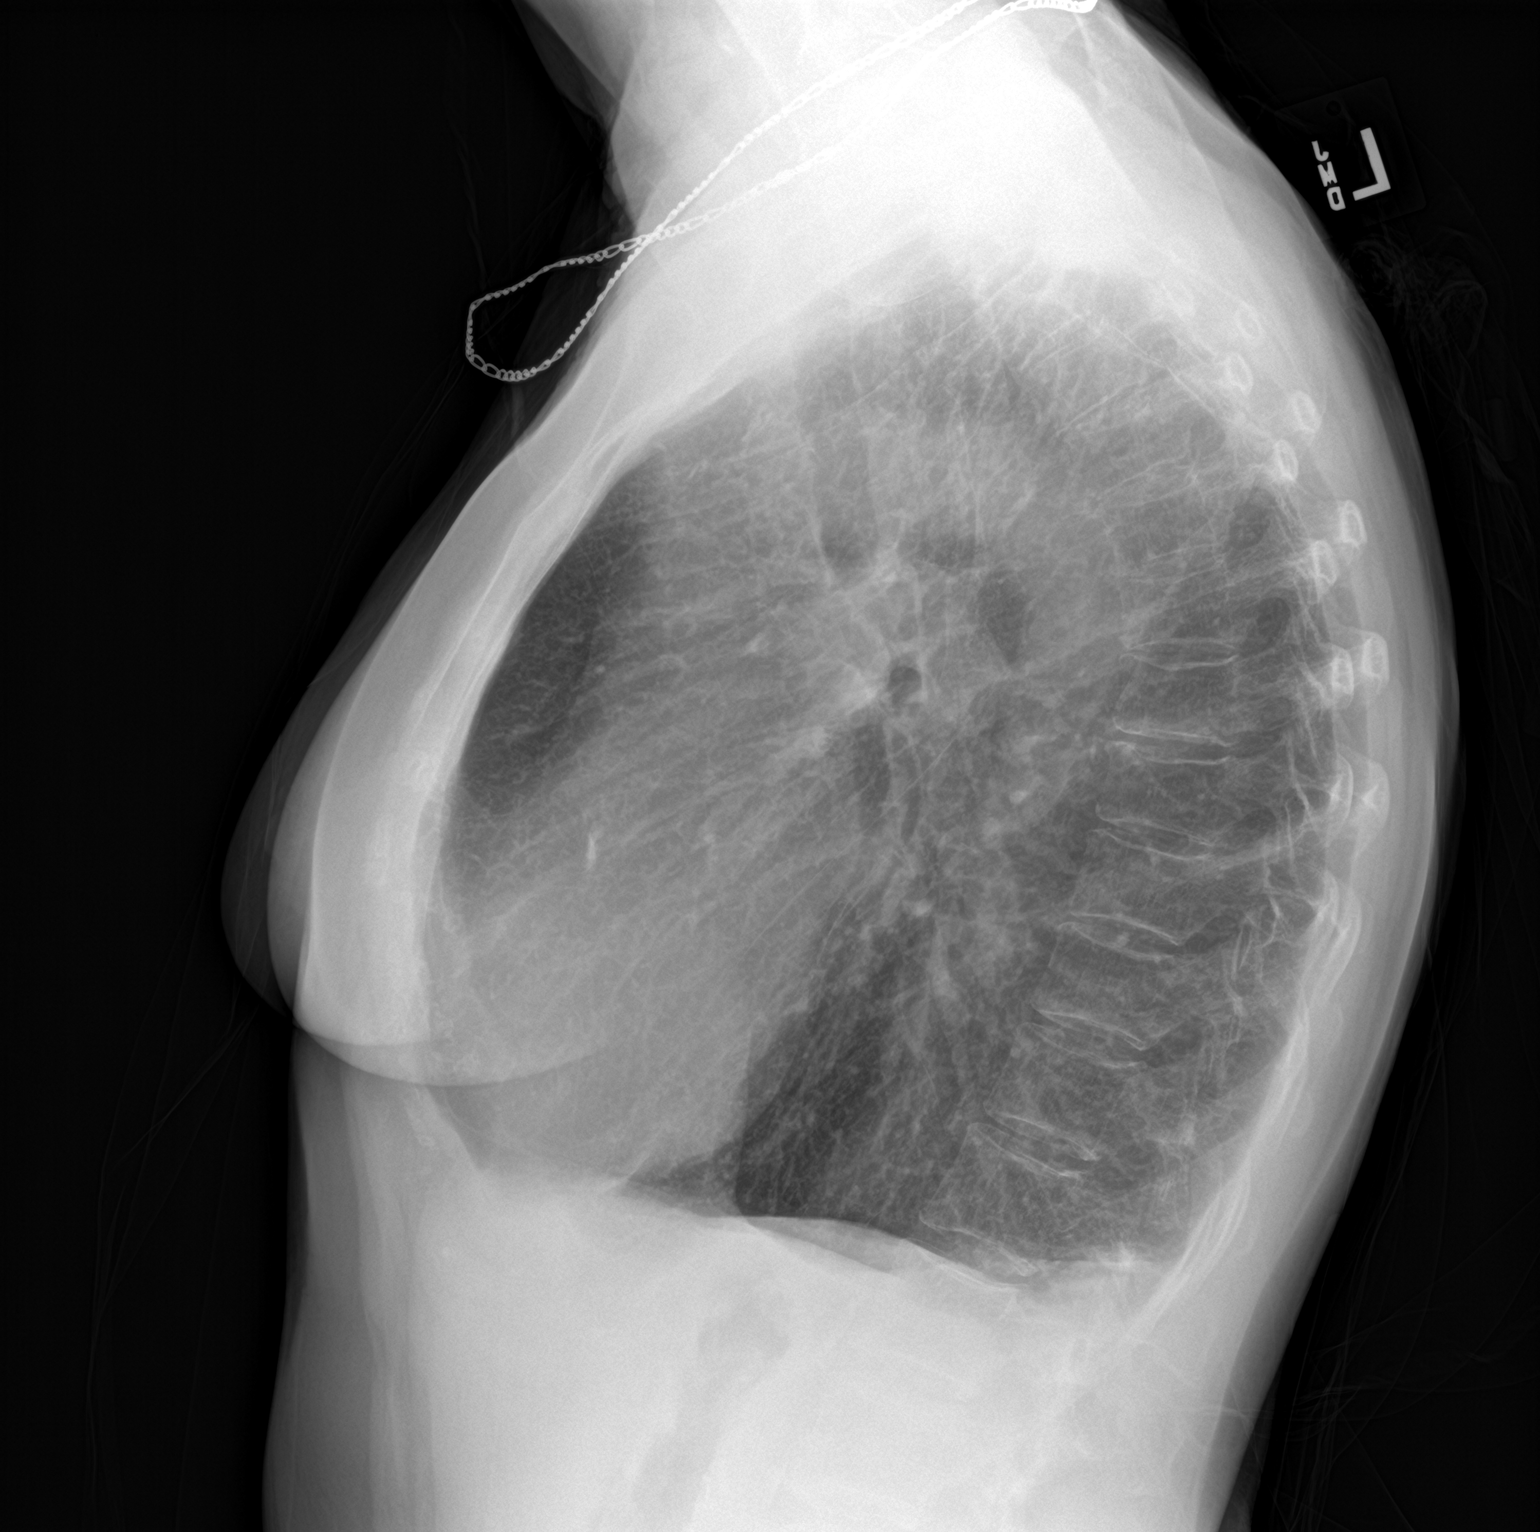

[2 of 2 positions shown; findings below may reference images not displayed]

FINDINGS: The cardiac silhouette, mediastinal and hilar contours are within
normal limits and stable. Stable emphysematous changes and areas of
pulmonary scarring. No infiltrates, edema or effusions. No worrisome
pulmonary lesions. The bony thorax is intact. Moderate osteoporosis
for age.
IMPRESSION: Emphysematous changes but no acute pulmonary findings or worrisome
pulmonary lesions.

## 2019-12-02 ENCOUNTER — Other Ambulatory Visit: Payer: Self-pay | Admitting: Family Medicine

## 2019-12-02 DIAGNOSIS — Z1231 Encounter for screening mammogram for malignant neoplasm of breast: Secondary | ICD-10-CM

## 2019-12-24 ENCOUNTER — Ambulatory Visit: Payer: Medicare Other

## 2020-01-09 DIAGNOSIS — Z23 Encounter for immunization: Secondary | ICD-10-CM | POA: Diagnosis not present

## 2020-02-05 DIAGNOSIS — Z1331 Encounter for screening for depression: Secondary | ICD-10-CM | POA: Diagnosis not present

## 2020-02-05 DIAGNOSIS — E7849 Other hyperlipidemia: Secondary | ICD-10-CM | POA: Diagnosis not present

## 2020-02-05 DIAGNOSIS — E890 Postprocedural hypothyroidism: Secondary | ICD-10-CM | POA: Diagnosis not present

## 2020-02-05 DIAGNOSIS — Z0001 Encounter for general adult medical examination with abnormal findings: Secondary | ICD-10-CM | POA: Diagnosis not present

## 2020-02-05 DIAGNOSIS — Z6821 Body mass index (BMI) 21.0-21.9, adult: Secondary | ICD-10-CM | POA: Diagnosis not present

## 2020-02-05 DIAGNOSIS — C73 Malignant neoplasm of thyroid gland: Secondary | ICD-10-CM | POA: Diagnosis not present

## 2020-02-05 DIAGNOSIS — I10 Essential (primary) hypertension: Secondary | ICD-10-CM | POA: Diagnosis not present

## 2020-02-13 DIAGNOSIS — Z1389 Encounter for screening for other disorder: Secondary | ICD-10-CM | POA: Diagnosis not present

## 2020-02-13 DIAGNOSIS — E7849 Other hyperlipidemia: Secondary | ICD-10-CM | POA: Diagnosis not present

## 2020-02-13 DIAGNOSIS — Z0001 Encounter for general adult medical examination with abnormal findings: Secondary | ICD-10-CM | POA: Diagnosis not present

## 2020-02-13 DIAGNOSIS — I10 Essential (primary) hypertension: Secondary | ICD-10-CM | POA: Diagnosis not present

## 2020-05-08 DIAGNOSIS — Z1331 Encounter for screening for depression: Secondary | ICD-10-CM | POA: Diagnosis not present

## 2020-05-08 DIAGNOSIS — Z6821 Body mass index (BMI) 21.0-21.9, adult: Secondary | ICD-10-CM | POA: Diagnosis not present

## 2020-05-08 DIAGNOSIS — F17200 Nicotine dependence, unspecified, uncomplicated: Secondary | ICD-10-CM | POA: Diagnosis not present

## 2020-05-08 DIAGNOSIS — F419 Anxiety disorder, unspecified: Secondary | ICD-10-CM | POA: Diagnosis not present

## 2020-05-08 DIAGNOSIS — J069 Acute upper respiratory infection, unspecified: Secondary | ICD-10-CM | POA: Diagnosis not present

## 2020-06-30 ENCOUNTER — Other Ambulatory Visit (HOSPITAL_COMMUNITY): Payer: Self-pay | Admitting: Family Medicine

## 2020-06-30 DIAGNOSIS — Z719 Counseling, unspecified: Secondary | ICD-10-CM | POA: Diagnosis not present

## 2020-06-30 DIAGNOSIS — Z6821 Body mass index (BMI) 21.0-21.9, adult: Secondary | ICD-10-CM | POA: Diagnosis not present

## 2020-06-30 DIAGNOSIS — R42 Dizziness and giddiness: Secondary | ICD-10-CM | POA: Diagnosis not present

## 2020-06-30 DIAGNOSIS — E2839 Other primary ovarian failure: Secondary | ICD-10-CM

## 2020-06-30 DIAGNOSIS — Z1231 Encounter for screening mammogram for malignant neoplasm of breast: Secondary | ICD-10-CM

## 2020-06-30 DIAGNOSIS — F17210 Nicotine dependence, cigarettes, uncomplicated: Secondary | ICD-10-CM | POA: Diagnosis not present

## 2020-11-02 DIAGNOSIS — F419 Anxiety disorder, unspecified: Secondary | ICD-10-CM | POA: Diagnosis not present

## 2020-11-02 DIAGNOSIS — J302 Other seasonal allergic rhinitis: Secondary | ICD-10-CM | POA: Diagnosis not present

## 2020-11-02 DIAGNOSIS — E7849 Other hyperlipidemia: Secondary | ICD-10-CM | POA: Diagnosis not present

## 2020-11-02 DIAGNOSIS — I10 Essential (primary) hypertension: Secondary | ICD-10-CM | POA: Diagnosis not present

## 2021-02-04 DIAGNOSIS — E782 Mixed hyperlipidemia: Secondary | ICD-10-CM | POA: Diagnosis not present

## 2021-02-04 DIAGNOSIS — I10 Essential (primary) hypertension: Secondary | ICD-10-CM | POA: Diagnosis not present

## 2021-02-04 DIAGNOSIS — C73 Malignant neoplasm of thyroid gland: Secondary | ICD-10-CM | POA: Diagnosis not present

## 2021-02-04 DIAGNOSIS — E890 Postprocedural hypothyroidism: Secondary | ICD-10-CM | POA: Diagnosis not present

## 2021-02-04 DIAGNOSIS — Z6822 Body mass index (BMI) 22.0-22.9, adult: Secondary | ICD-10-CM | POA: Diagnosis not present

## 2021-02-10 ENCOUNTER — Other Ambulatory Visit: Payer: Self-pay

## 2021-02-10 ENCOUNTER — Ambulatory Visit (HOSPITAL_COMMUNITY)
Admission: RE | Admit: 2021-02-10 | Discharge: 2021-02-10 | Disposition: A | Discharge status: Home / Self Care | Payer: Medicare Other | Source: Ambulatory Visit | Attending: Family Medicine | Admitting: Family Medicine

## 2021-02-10 DIAGNOSIS — E2839 Other primary ovarian failure: Secondary | ICD-10-CM | POA: Insufficient documentation

## 2021-02-10 DIAGNOSIS — Z780 Asymptomatic menopausal state: Secondary | ICD-10-CM | POA: Diagnosis not present

## 2021-02-10 DIAGNOSIS — Z1231 Encounter for screening mammogram for malignant neoplasm of breast: Secondary | ICD-10-CM | POA: Insufficient documentation

## 2021-02-10 DIAGNOSIS — M810 Age-related osteoporosis without current pathological fracture: Secondary | ICD-10-CM | POA: Diagnosis not present

## 2021-03-02 DIAGNOSIS — U071 COVID-19: Secondary | ICD-10-CM | POA: Diagnosis not present

## 2021-03-02 DIAGNOSIS — E039 Hypothyroidism, unspecified: Secondary | ICD-10-CM | POA: Diagnosis not present

## 2021-03-02 DIAGNOSIS — J329 Chronic sinusitis, unspecified: Secondary | ICD-10-CM | POA: Diagnosis not present

## 2021-03-02 DIAGNOSIS — I10 Essential (primary) hypertension: Secondary | ICD-10-CM | POA: Diagnosis not present

## 2021-03-30 DIAGNOSIS — E782 Mixed hyperlipidemia: Secondary | ICD-10-CM | POA: Diagnosis not present

## 2021-03-30 DIAGNOSIS — E039 Hypothyroidism, unspecified: Secondary | ICD-10-CM | POA: Diagnosis not present

## 2021-03-30 DIAGNOSIS — Z6822 Body mass index (BMI) 22.0-22.9, adult: Secondary | ICD-10-CM | POA: Diagnosis not present

## 2021-03-30 DIAGNOSIS — E890 Postprocedural hypothyroidism: Secondary | ICD-10-CM | POA: Diagnosis not present

## 2021-03-30 DIAGNOSIS — E7849 Other hyperlipidemia: Secondary | ICD-10-CM | POA: Diagnosis not present

## 2021-03-30 DIAGNOSIS — Z0000 Encounter for general adult medical examination without abnormal findings: Secondary | ICD-10-CM | POA: Diagnosis not present

## 2021-03-30 DIAGNOSIS — I10 Essential (primary) hypertension: Secondary | ICD-10-CM | POA: Diagnosis not present

## 2021-05-20 DIAGNOSIS — Z681 Body mass index (BMI) 19 or less, adult: Secondary | ICD-10-CM | POA: Diagnosis not present

## 2021-05-20 DIAGNOSIS — E7849 Other hyperlipidemia: Secondary | ICD-10-CM | POA: Diagnosis not present

## 2021-05-20 DIAGNOSIS — E039 Hypothyroidism, unspecified: Secondary | ICD-10-CM | POA: Diagnosis not present

## 2021-05-20 DIAGNOSIS — J40 Bronchitis, not specified as acute or chronic: Secondary | ICD-10-CM | POA: Diagnosis not present

## 2021-05-20 DIAGNOSIS — I10 Essential (primary) hypertension: Secondary | ICD-10-CM | POA: Diagnosis not present

## 2021-10-19 DIAGNOSIS — Z681 Body mass index (BMI) 19 or less, adult: Secondary | ICD-10-CM | POA: Diagnosis not present

## 2021-10-19 DIAGNOSIS — E039 Hypothyroidism, unspecified: Secondary | ICD-10-CM | POA: Diagnosis not present

## 2021-11-17 DIAGNOSIS — H11153 Pinguecula, bilateral: Secondary | ICD-10-CM | POA: Diagnosis not present

## 2021-11-17 DIAGNOSIS — H2513 Age-related nuclear cataract, bilateral: Secondary | ICD-10-CM | POA: Diagnosis not present

## 2021-11-17 DIAGNOSIS — H01001 Unspecified blepharitis right upper eyelid: Secondary | ICD-10-CM | POA: Diagnosis not present

## 2021-11-17 DIAGNOSIS — H01004 Unspecified blepharitis left upper eyelid: Secondary | ICD-10-CM | POA: Diagnosis not present

## 2021-11-17 DIAGNOSIS — H35373 Puckering of macula, bilateral: Secondary | ICD-10-CM | POA: Diagnosis not present

## 2021-12-23 NOTE — H&P (Signed)
Surgical History & Physical  Patient Name: Monique Lopez  DOB: [birth year]  Surgery: Cataract extraction with intraocular lens implant phacoemulsification; Right Eye Surgeon: Ronn Melena MD Surgery Date: 12/31/2021 Pre-Op Date: 12/23/2021  HPI: A 25 Yr. old female patient presents for a cataract evaluation. Patient was referred by Dr. Mayford Knife at [facility]. Patient reports that she is having difficulty with reading small print due to hazy blurred vision. Patient states that she has difficulty with recognizing people from [place]. Patient reports that she has difficulty with seeing steps due to her current vision. Patient states that she has difficulty reading any small print or captioning on the television. Patient states that her vision causes driving to be difficult both day and night due to the glare from sunlight and headlights.  Medical History:  LDL Thyroid Problems  Review of Systems Endocrine Thyroid Psychiatry Anxiety Respiratory COPD All recorded systems are negative except as noted above.  Social Never Smoked  Medication ALPRAZOLAM, ESCITALOPRAM OXALATE,  ATORVASTATIN CALCIUM,  LEVOTHYROXINE SODIUM,  Aspirin   Sx/Procedures None  Drug Allergies  Penicillin  History & Physical: Heent: cataracts NECK: supple without bruits LUNGS: lungs clear to auscultation CV: regular rate and rhythm Abdomen: soft and non-tender  Impression & Plan: Assessment: 1.  Myopia ; Both Eyes (H52.13) 2.  BLEPHARITIS; Right Upper Lid, Left Upper Lid (H01.001, H01.004) 3.  Pinguecula; Both Eyes (H11.153) 4.  CATARACT AGE-RELATED NUCLEAR; Both Eyes (H25.13) 5.  Epiretinal Membrane; Both Eyes (H35.373)  Plan: 1.  will defer to Dr. Hassell Done  2.  Discussed warm compresses daily and lid scrubs 1 to 2 days daily  3.  Artificial tears PRN  4.  Cataracts are visually significant and account for the patient's complaints. Discussed all risks, benefits, procedures and recovery,  including infection, loss of vision and eye, need for glasses after surgery or additional procedures. Patient understands changing glasses will not improve vision. Patient indicated understanding of procedure. All questions answered. Patient desires to have surgery, recommend phacoemulsification with intraocular lens. Patient to have preliminary testing necessary (Argos/IOL Master, Mac OCT, TOPO) Educational materials provided.  Plan: - Proceed with surgery with -0.25 target OD followed by OS - RayOne lenses - Omidria and combo drop - ok with lying flat, no prior surgeries  5.  Discussed diagnosis in detail with patient. Discussed treatment options with patient including vitrectomy.

## 2021-12-24 ENCOUNTER — Encounter (HOSPITAL_COMMUNITY)
Admission: RE | Admit: 2021-12-24 | Discharge: 2021-12-24 | Disposition: A | Discharge status: Home / Self Care | Payer: Medicare Other | Source: Ambulatory Visit | Attending: Optometry | Admitting: Optometry

## 2021-12-24 DIAGNOSIS — H2511 Age-related nuclear cataract, right eye: Secondary | ICD-10-CM | POA: Diagnosis not present

## 2021-12-27 DIAGNOSIS — J069 Acute upper respiratory infection, unspecified: Secondary | ICD-10-CM | POA: Diagnosis not present

## 2021-12-27 DIAGNOSIS — Z681 Body mass index (BMI) 19 or less, adult: Secondary | ICD-10-CM | POA: Diagnosis not present

## 2021-12-28 ENCOUNTER — Encounter (HOSPITAL_COMMUNITY): Payer: Self-pay

## 2021-12-28 ENCOUNTER — Other Ambulatory Visit: Payer: Self-pay

## 2021-12-28 NOTE — Progress Notes (Signed)
Called pt for phone interview. No answer

## 2021-12-31 ENCOUNTER — Encounter (HOSPITAL_COMMUNITY)
Admission: RE | Disposition: A | Discharge status: Home / Self Care | Payer: Self-pay | Source: Home / Self Care | Attending: Optometry

## 2021-12-31 ENCOUNTER — Ambulatory Visit (HOSPITAL_BASED_OUTPATIENT_CLINIC_OR_DEPARTMENT_OTHER): Payer: Medicare Other | Admitting: Anesthesiology

## 2021-12-31 ENCOUNTER — Encounter (HOSPITAL_COMMUNITY): Payer: Self-pay | Admitting: Optometry

## 2021-12-31 ENCOUNTER — Ambulatory Visit (HOSPITAL_COMMUNITY)
Admission: RE | Admit: 2021-12-31 | Discharge: 2021-12-31 | Disposition: A | Discharge status: Home / Self Care | Payer: Medicare Other | Attending: Optometry | Admitting: Optometry

## 2021-12-31 ENCOUNTER — Ambulatory Visit (HOSPITAL_COMMUNITY): Payer: Medicare Other | Admitting: Anesthesiology

## 2021-12-31 DIAGNOSIS — J449 Chronic obstructive pulmonary disease, unspecified: Secondary | ICD-10-CM | POA: Diagnosis not present

## 2021-12-31 DIAGNOSIS — E039 Hypothyroidism, unspecified: Secondary | ICD-10-CM | POA: Diagnosis not present

## 2021-12-31 DIAGNOSIS — H01004 Unspecified blepharitis left upper eyelid: Secondary | ICD-10-CM | POA: Diagnosis not present

## 2021-12-31 DIAGNOSIS — H01001 Unspecified blepharitis right upper eyelid: Secondary | ICD-10-CM | POA: Insufficient documentation

## 2021-12-31 DIAGNOSIS — F17210 Nicotine dependence, cigarettes, uncomplicated: Secondary | ICD-10-CM

## 2021-12-31 DIAGNOSIS — H5213 Myopia, bilateral: Secondary | ICD-10-CM | POA: Diagnosis not present

## 2021-12-31 DIAGNOSIS — H2511 Age-related nuclear cataract, right eye: Secondary | ICD-10-CM | POA: Diagnosis not present

## 2021-12-31 DIAGNOSIS — H25811 Combined forms of age-related cataract, right eye: Secondary | ICD-10-CM

## 2021-12-31 DIAGNOSIS — F17200 Nicotine dependence, unspecified, uncomplicated: Secondary | ICD-10-CM | POA: Diagnosis not present

## 2021-12-31 DIAGNOSIS — H2513 Age-related nuclear cataract, bilateral: Secondary | ICD-10-CM | POA: Insufficient documentation

## 2021-12-31 DIAGNOSIS — H11153 Pinguecula, bilateral: Secondary | ICD-10-CM | POA: Diagnosis not present

## 2021-12-31 DIAGNOSIS — H35373 Puckering of macula, bilateral: Secondary | ICD-10-CM | POA: Insufficient documentation

## 2021-12-31 HISTORY — PX: CATARACT EXTRACTION W/PHACO: SHX586

## 2021-12-31 SURGERY — PHACOEMULSIFICATION, CATARACT, WITH IOL INSERTION
Anesthesia: Monitor Anesthesia Care | Site: Eye | Laterality: Right

## 2021-12-31 MED ORDER — PHENYLEPHRINE-KETOROLAC 1-0.3 % IO SOLN
INTRAOCULAR | Status: DC | PRN
  Administered 2021-12-31: 500 mL via OPHTHALMIC

## 2021-12-31 MED ORDER — TROPICAMIDE 1 % OP SOLN
1.0000 [drp] | OPHTHALMIC | Status: AC
  Administered 2021-12-31 (×3): 1 [drp] via OPHTHALMIC
  Filled 2021-12-31: qty 3

## 2021-12-31 MED ORDER — POVIDONE-IODINE 5 % OP SOLN
OPHTHALMIC | Status: DC | PRN
  Administered 2021-12-31: 1 via OPHTHALMIC

## 2021-12-31 MED ORDER — PHENYLEPHRINE HCL 2.5 % OP SOLN
1.0000 [drp] | OPHTHALMIC | Status: AC
  Administered 2021-12-31 (×3): 1 [drp] via OPHTHALMIC

## 2021-12-31 MED ORDER — ONDANSETRON HCL 4 MG/2ML IJ SOLN
INTRAMUSCULAR | Status: DC | PRN
  Administered 2021-12-31: 4 mg via INTRAVENOUS

## 2021-12-31 MED ORDER — SIGHTPATH DOSE#1 NA HYALUR & NA CHOND-NA HYALUR IO KIT
PACK | INTRAOCULAR | Status: DC | PRN
  Administered 2021-12-31: 1 via OPHTHALMIC

## 2021-12-31 MED ORDER — SODIUM CHLORIDE 0.9% FLUSH
INTRAVENOUS | Status: DC | PRN
  Administered 2021-12-31: 5 mL via INTRAVENOUS

## 2021-12-31 MED ORDER — STERILE WATER FOR IRRIGATION IR SOLN
Status: DC | PRN
  Administered 2021-12-31: 250 mL

## 2021-12-31 MED ORDER — LIDOCAINE HCL (PF) 1 % IJ SOLN
INTRAMUSCULAR | Status: DC | PRN
  Administered 2021-12-31: 1 mL

## 2021-12-31 MED ORDER — MOXIFLOXACIN HCL 5 MG/ML IO SOLN
INTRAOCULAR | Status: AC
  Filled 2021-12-31: qty 1

## 2021-12-31 MED ORDER — LIDOCAINE HCL 3.5 % OP GEL
1.0000 | Freq: Once | OPHTHALMIC | Status: AC
  Administered 2021-12-31: 1 via OPHTHALMIC

## 2021-12-31 MED ORDER — MOXIFLOXACIN HCL 0.5 % OP SOLN
OPHTHALMIC | Status: DC | PRN
  Administered 2021-12-31: .2 mL via OPHTHALMIC

## 2021-12-31 MED ORDER — TETRACAINE HCL 0.5 % OP SOLN
1.0000 [drp] | OPHTHALMIC | Status: AC
  Administered 2021-12-31 (×3): 1 [drp] via OPHTHALMIC

## 2021-12-31 MED ORDER — MIDAZOLAM HCL 2 MG/2ML IJ SOLN
INTRAMUSCULAR | Status: DC | PRN
  Administered 2021-12-31: 2 mg via INTRAVENOUS

## 2021-12-31 MED ORDER — PHENYLEPHRINE-KETOROLAC 1-0.3 % IO SOLN
INTRAOCULAR | Status: AC
  Filled 2021-12-31: qty 4

## 2021-12-31 MED ORDER — BSS IO SOLN
INTRAOCULAR | Status: DC | PRN
  Administered 2021-12-31: 15 mL via INTRAOCULAR

## 2021-12-31 MED ORDER — MIDAZOLAM HCL 2 MG/2ML IJ SOLN
INTRAMUSCULAR | Status: AC
  Filled 2021-12-31: qty 2

## 2021-12-31 SURGICAL SUPPLY — 18 items
CATARACT SUITE SIGHTPATH (MISCELLANEOUS) ×1 IMPLANT
CLOTH BEACON ORANGE TIMEOUT ST (SAFETY) ×1 IMPLANT
DRAPE HALF SHEET 40X57 (DRAPES) IMPLANT
DRSG TEGADERM 2-3/8X2-3/4 SM (GAUZE/BANDAGES/DRESSINGS) IMPLANT
DRSG TEGADERM 4X4.75 (GAUZE/BANDAGES/DRESSINGS) ×1 IMPLANT
EYE SHIELD UNIVERSAL CLEAR (GAUZE/BANDAGES/DRESSINGS) IMPLANT
FEE CATARACT SUITE SIGHTPATH (MISCELLANEOUS) ×1 IMPLANT
GLOVE BIOGEL PI IND STRL 6.5 (GLOVE) IMPLANT
GLOVE BIOGEL PI IND STRL 7.0 (GLOVE) ×2 IMPLANT
LENS IOL RAYNER 20.5 (Intraocular Lens) ×1 IMPLANT
LENS IOL RAYONE EMV 20.5 (Intraocular Lens) IMPLANT
NDL HYPO 18GX1.5 BLUNT FILL (NEEDLE) ×1 IMPLANT
NEEDLE HYPO 18GX1.5 BLUNT FILL (NEEDLE) ×1 IMPLANT
PAD ARMBOARD 7.5X6 YLW CONV (MISCELLANEOUS) ×1 IMPLANT
SYR TB 1ML LL NO SAFETY (SYRINGE) ×1 IMPLANT
TAPE SURG TRANSPORE 1 IN (GAUZE/BANDAGES/DRESSINGS) IMPLANT
TAPE SURGICAL TRANSPORE 1 IN (GAUZE/BANDAGES/DRESSINGS) ×1
WATER STERILE IRR 250ML POUR (IV SOLUTION) ×1 IMPLANT

## 2021-12-31 NOTE — Transfer of Care (Signed)
Immediate Anesthesia Transfer of Care Note  Patient: Monique Lopez  Procedure(s) Performed: CATARACT EXTRACTION PHACO AND INTRAOCULAR LENS PLACEMENT (IOC) (Right: Eye)  Patient Location: Short Stay  Anesthesia Type:MAC  Level of Consciousness: awake, alert , oriented, and patient cooperative  Airway & Oxygen Therapy: Patient Spontanous Breathing  Post-op Assessment: Report given to RN, Post -op Vital signs reviewed and stable, and Patient moving all extremities X 4  Post vital signs: Reviewed and stable  Last Vitals:  Vitals Value Taken Time  BP    Temp    Pulse    Resp    SpO2      Last Pain:  Vitals:   12/31/21 0701  TempSrc: Oral  PainSc: 0-No pain      Patients Stated Pain Goal: 5 (57/84/69 [id number])  Complications: No notable events documented.

## 2021-12-31 NOTE — Anesthesia Postprocedure Evaluation (Signed)
Anesthesia Post Note  Patient: Monique Lopez  Procedure(s) Performed: CATARACT EXTRACTION PHACO AND INTRAOCULAR LENS PLACEMENT (IOC) (Right: Eye)  Patient location during evaluation: Phase II Anesthesia Type: MAC Level of consciousness: awake and alert and oriented Pain management: pain level controlled Vital Signs Assessment: post-procedure vital signs reviewed and stable Respiratory status: spontaneous breathing, nonlabored ventilation and respiratory function stable Cardiovascular status: blood pressure returned to baseline and stable Postop Assessment: no apparent nausea or vomiting Anesthetic complications: no  No notable events documented.   Last Vitals:  Vitals:   12/31/21 0701 12/31/21 0830  BP: (!) 152/93 (!) 149/68  Pulse: 70 68  Resp: 16 15  Temp: 36.8 C 36.7 C  SpO2: 96% 98%    Last Pain:  Vitals:   12/31/21 0830  TempSrc: Oral  PainSc: 0-No pain                 An Schnabel C Masako Overall

## 2021-12-31 NOTE — Op Note (Signed)
Date of procedure: 12/31/21  Pre-operative diagnosis: Visually significant age-related nuclear cataract, Right Eye (H25.11)  Post-operative diagnosis: Visually significant age-related nuclear cataract, Right Eye  Procedure: Removal of cataract via phacoemulsification and insertion of intra-ocular lens Rayner [id number] +20.5D into the capsular bag of the Right Eye  Attending surgeon: Ronn Melena, MD  Anesthesia: MAC, Topical Akten  Complications: None  Estimated Blood Loss: <27m (minimal)  Specimens: None  Implants:  Implant Name Type Inv. Item Serial No. Manufacturer Lot No. LRB No. Used Action  LENS IOL RAYNER 20.5 - S65 Intraocular Lens LENS IOL RAYNER 20.5 65 [employer] [id number] Implanted    Indications:  Visually significant age-related cataract, Right Eye  Procedure:  The patient was seen and identified in the pre-operative area. The operative eye was identified and dilated.  The operative eye was marked.  Topical anesthesia was administered to the operative eye.     The patient was then to the operative suite and placed in the supine position.  A timeout was performed confirming the patient, procedure to be performed, and all other relevant information.   The patient's face was prepped and draped in the usual fashion for intra-ocular surgery.  A lid speculum was placed into the operative eye and the surgical microscope moved into place and focused.  A superotemporal paracentesis was created using a 20 gauge paracentesis blade.  BSS mixed with Omidria, followed by 1% lidocaine was injected into the anterior chamber.  Viscoelastic was injected into the anterior chamber.  A temporal clear-corneal main wound incision was created using a 2.484mmicrokeratome.  A continuous curvilinear capsulorrhexis was initiated using an irrigating cystitome and completed using capsulorrhexis forceps.  Hydrodissection and hydrodeliniation were performed.  Viscoelastic was injected into the  anterior chamber.  A phacoemulsification handpiece and a chopper as a second instrument were used to remove the nucleus and epinucleus. The irrigation/aspiration handpiece was used to remove any remaining cortical material.   The capsular bag was reinflated with viscoelastic, checked, and found to be intact.  The intraocular lens was inserted into the capsular bag.  The irrigation/aspiration handpiece was used to remove any remaining viscoelastic.  The intraocular lens centered well, while there was approximately 2 clock hour area of zonular weakness noted superiorly. Given the well centered lens, no capsular tension ring was placed. The clear corneal wound and paracentesis wounds were then hydrated and checked with Weck-Cels to be watertight.  The lid-speculum and drape was removed, and the patient's face was cleaned with a wet and dry 4x4.  Moxifloxacin drops were instilled onto the eye. A clear shield was taped over the eye. The patient was taken to the post-operative care unit in good condition, having tolerated the procedure well.  Post-Op Instructions: [facility] and will receive all other orders and instructions.

## 2021-12-31 NOTE — Anesthesia Preprocedure Evaluation (Addendum)
Anesthesia Evaluation  Patient identified by MRN, date of birth, ID band Patient awake    Reviewed: Allergy & Precautions, H&P , NPO status , Patient's Chart, lab work & pertinent test results  History of Anesthesia Complications (+) PONV and history of anesthetic complications  Airway Mallampati: II  TM Distance: >3 FB Neck ROM: Full    Dental  (+) Dental Advisory Given, Edentulous Upper, Missing   Pulmonary COPD,  COPD inhaler, Current Smoker and Patient abstained from smoking.   Pulmonary exam normal breath sounds clear to auscultation       Cardiovascular negative cardio ROS Normal cardiovascular exam Rhythm:Regular Rate:Normal     Neuro/Psych negative neurological ROS  negative psych ROS   GI/Hepatic Neg liver ROS,GERD  Controlled,,  Endo/Other  Hypothyroidism    Renal/GU negative Renal ROS  negative genitourinary   Musculoskeletal  (+) Arthritis , Osteoarthritis,    Abdominal   Peds negative pediatric ROS (+)  Hematology negative hematology ROS (+)   Anesthesia Other Findings   Reproductive/Obstetrics negative OB ROS                              Anesthesia Physical Anesthesia Plan  ASA: 2  Anesthesia Plan: MAC   Post-op Pain Management: Minimal or no pain anticipated   Induction: Intravenous  PONV Risk Score and Plan: 1 and Treatment may vary due to age or medical condition  Airway Management Planned: Nasal Cannula and Natural Airway  Additional Equipment:   Intra-op Plan:   Post-operative Plan:   Informed Consent: I have reviewed the patients History and Physical, chart, labs and discussed the procedure including the risks, benefits and alternatives for the proposed anesthesia with the patient or authorized representative who has indicated his/her understanding and acceptance.     Dental advisory given  Plan Discussed with: CRNA and Surgeon  Anesthesia Plan  Comments:         Anesthesia Quick Evaluation  

## 2021-12-31 NOTE — Discharge Instructions (Signed)
Please discharge patient when stable, will follow up today with Dr. Graylon Good at the [facility] office immediately following discharge.  Leave shield in place until visit.  All paperwork with discharge instructions will be given at the office.  [facility] Address:  [place], [place] [address]  Dr. Bynum Bellows Phone: [phone]

## 2021-12-31 NOTE — Interval H&P Note (Signed)
History and Physical Interval Note:  12/31/2021 7:49 AM  The H and P was reviewed and updated. The patient was examined.  No changes were found after exam.  The surgical eye was marked.   Roben Tatsch

## 2022-01-06 ENCOUNTER — Encounter (HOSPITAL_COMMUNITY): Payer: Self-pay | Admitting: Optometry

## 2022-01-17 NOTE — H&P (Signed)
Surgical History & Physical  Patient Name: Monique Lopez  DOB: 01/17/1951  Surgery: Cataract extraction with intraocular lens implant phacoemulsification; Left Eye Surgeon: Ronn Melena MD Surgery Date: 01/28/2022 Pre-Op Date: 01/12/2022  HPI: A 83 Yr. old female patient 1. The patient is returning after cataract surgery. The right eye is affected. Status post cataract surgery, which began 1 1/2 weeks ago: Since the last visit, the affected area feels improvement. Patient is following medication instructions: Prednisolone 6-8 times a day. 2. 2. Difficulties seeing captions on TV, driving due to glare during the day and night. Blurred vision OS. This is negatively affecting the patient's quality of life and the patient is unable to function adequately in life with the current level of vision. Patient would like to proceed with cataract sx OS.  Medical History: Cataracts LDL Thyroid Problems  Review of Systems Endocrine Thyroid Psychiatry Anxiety Respiratory COPD All recorded systems are negative except as noted above.  Social Never Smoked  Medication Prednisolone acetate 1%,  ALPRAZOLAM, ESCITALOPRAM OXALATE, ATORVASTATIN CALCIUM, LEVOTHYROXINE SODIUM, Aspirin   Sx/Procedures Phaco c IOL OD    Drug Allergies  Penicillin  History & Physical: Heent: cataract OS, phaco IOL OD NECK: supple without bruits LUNGS: lungs clear to auscultation CV: regular rate and rhythm Abdomen: soft and non-tender  Impression & Plan: Assessment: 1.  CATARACT EXTRACTION STATUS; Right Eye (Z98.41) 2.  INTRAOCULAR LENS IOL (Z96.1) 3.  CATARACT AGE-RELATED NUCLEAR; , Left Eye (H25.12)  Plan: 1.  POW2, cornea now healed. Slightly myopic outcome, patent happy with vision. Will taper PF 3-2-1 by week Proceed with surgery OS as scheduled.  2.  Monitor   3.  Cataracts are visually significant and account for the patient's complaints. Discussed all risks, benefits, procedures and recovery,  including infection, loss of vision and eye, need for glasses after surgery or additional procedures. Patient understands changing glasses will not improve vision. Patient indicated understanding of procedure. All questions answered. Patient desires to have surgery, recommend phacoemulsification with intraocular lens. Patient to have preliminary testing necessary (Argos/IOL Master, Mac OCT, TOPO) Educational materials provided.  Plan: - Proceed with surgery with -0.25 target OS - RayOne lenses - Omidria and combo drop - ok with lying flat, no prior surgeries

## 2022-01-21 DIAGNOSIS — H2512 Age-related nuclear cataract, left eye: Secondary | ICD-10-CM | POA: Diagnosis not present

## 2022-01-25 ENCOUNTER — Other Ambulatory Visit: Payer: Self-pay

## 2022-01-25 ENCOUNTER — Encounter (HOSPITAL_COMMUNITY)
Admission: RE | Admit: 2022-01-25 | Discharge: 2022-01-25 | Disposition: A | Discharge status: Home / Self Care | Payer: Medicare Other | Source: Ambulatory Visit | Attending: Optometry | Admitting: Optometry

## 2022-01-25 ENCOUNTER — Encounter (HOSPITAL_COMMUNITY): Payer: Self-pay

## 2022-01-28 ENCOUNTER — Ambulatory Visit (HOSPITAL_COMMUNITY): Payer: Medicare Other | Admitting: Certified Registered Nurse Anesthetist

## 2022-01-28 ENCOUNTER — Ambulatory Visit (HOSPITAL_BASED_OUTPATIENT_CLINIC_OR_DEPARTMENT_OTHER): Payer: Medicare Other | Admitting: Certified Registered Nurse Anesthetist

## 2022-01-28 ENCOUNTER — Encounter (HOSPITAL_COMMUNITY): Payer: Self-pay | Admitting: Optometry

## 2022-01-28 ENCOUNTER — Ambulatory Visit (HOSPITAL_COMMUNITY)
Admission: RE | Admit: 2022-01-28 | Discharge: 2022-01-28 | Disposition: A | Discharge status: Home / Self Care | Payer: Medicare Other | Attending: Optometry | Admitting: Optometry

## 2022-01-28 ENCOUNTER — Encounter (HOSPITAL_COMMUNITY)
Admission: RE | Disposition: A | Discharge status: Home / Self Care | Payer: Self-pay | Source: Home / Self Care | Attending: Optometry

## 2022-01-28 DIAGNOSIS — M1990 Unspecified osteoarthritis, unspecified site: Secondary | ICD-10-CM | POA: Insufficient documentation

## 2022-01-28 DIAGNOSIS — H2512 Age-related nuclear cataract, left eye: Secondary | ICD-10-CM

## 2022-01-28 DIAGNOSIS — K219 Gastro-esophageal reflux disease without esophagitis: Secondary | ICD-10-CM | POA: Insufficient documentation

## 2022-01-28 DIAGNOSIS — J449 Chronic obstructive pulmonary disease, unspecified: Secondary | ICD-10-CM

## 2022-01-28 DIAGNOSIS — E039 Hypothyroidism, unspecified: Secondary | ICD-10-CM | POA: Diagnosis not present

## 2022-01-28 DIAGNOSIS — F17210 Nicotine dependence, cigarettes, uncomplicated: Secondary | ICD-10-CM

## 2022-01-28 DIAGNOSIS — Z961 Presence of intraocular lens: Secondary | ICD-10-CM | POA: Diagnosis not present

## 2022-01-28 DIAGNOSIS — Z9841 Cataract extraction status, right eye: Secondary | ICD-10-CM | POA: Diagnosis not present

## 2022-01-28 HISTORY — PX: CATARACT EXTRACTION W/PHACO: SHX586

## 2022-01-28 SURGERY — PHACOEMULSIFICATION, CATARACT, WITH IOL INSERTION
Anesthesia: Monitor Anesthesia Care | Site: Eye | Laterality: Left

## 2022-01-28 MED ORDER — TETRACAINE HCL 0.5 % OP SOLN
OPHTHALMIC | Status: DC | PRN
  Administered 2022-01-28: 1 [drp] via OPHTHALMIC

## 2022-01-28 MED ORDER — TETRACAINE HCL 0.5 % OP SOLN
1.0000 [drp] | OPHTHALMIC | Status: AC | PRN
  Administered 2022-01-28 (×3): 1 [drp] via OPHTHALMIC

## 2022-01-28 MED ORDER — TROPICAMIDE 1 % OP SOLN
1.0000 [drp] | OPHTHALMIC | Status: AC | PRN
  Administered 2022-01-28 (×3): 1 [drp] via OPHTHALMIC
  Filled 2022-01-28: qty 3

## 2022-01-28 MED ORDER — STERILE WATER FOR IRRIGATION IR SOLN
Status: DC | PRN
  Administered 2022-01-28: 25 mL

## 2022-01-28 MED ORDER — PHENYLEPHRINE HCL 2.5 % OP SOLN
1.0000 [drp] | OPHTHALMIC | Status: AC | PRN
  Administered 2022-01-28 (×3): 1 [drp] via OPHTHALMIC

## 2022-01-28 MED ORDER — MOXIFLOXACIN HCL 5 MG/ML IO SOLN
INTRAOCULAR | Status: AC
  Filled 2022-01-28: qty 1

## 2022-01-28 MED ORDER — MIDAZOLAM HCL 2 MG/2ML IJ SOLN
INTRAMUSCULAR | Status: AC
  Filled 2022-01-28: qty 2

## 2022-01-28 MED ORDER — MIDAZOLAM HCL 5 MG/5ML IJ SOLN
INTRAMUSCULAR | Status: DC | PRN
  Administered 2022-01-28 (×2): 1 mg via INTRAVENOUS

## 2022-01-28 MED ORDER — MOXIFLOXACIN HCL 0.5 % OP SOLN
OPHTHALMIC | Status: DC | PRN
  Administered 2022-01-28: .3 mL via OPHTHALMIC

## 2022-01-28 MED ORDER — SIGHTPATH DOSE#1 NA HYALUR & NA CHOND-NA HYALUR IO KIT
PACK | INTRAOCULAR | Status: DC | PRN
  Administered 2022-01-28: 1 via OPHTHALMIC

## 2022-01-28 MED ORDER — PHENYLEPHRINE-KETOROLAC 1-0.3 % IO SOLN
INTRAOCULAR | Status: DC | PRN
  Administered 2022-01-28: 500 mL via OPHTHALMIC

## 2022-01-28 MED ORDER — LIDOCAINE HCL 3.5 % OP GEL
1.0000 | Freq: Once | OPHTHALMIC | Status: AC
  Administered 2022-01-28: 1 via OPHTHALMIC

## 2022-01-28 MED ORDER — LIDOCAINE HCL (PF) 1 % IJ SOLN
INTRAMUSCULAR | Status: DC | PRN
  Administered 2022-01-28: 1 mL

## 2022-01-28 MED ORDER — BSS IO SOLN
INTRAOCULAR | Status: DC | PRN
  Administered 2022-01-28: 15 mL via INTRAOCULAR

## 2022-01-28 MED ORDER — POVIDONE-IODINE 5 % OP SOLN
OPHTHALMIC | Status: DC | PRN
  Administered 2022-01-28: 1 via OPHTHALMIC

## 2022-01-28 SURGICAL SUPPLY — 16 items
CATARACT SUITE SIGHTPATH (MISCELLANEOUS) ×1 IMPLANT
CLOTH BEACON ORANGE TIMEOUT ST (SAFETY) ×1 IMPLANT
DRSG TEGADERM 4X4.75 (GAUZE/BANDAGES/DRESSINGS) ×1 IMPLANT
EYE SHIELD UNIVERSAL CLEAR (GAUZE/BANDAGES/DRESSINGS) IMPLANT
FEE CATARACT SUITE SIGHTPATH (MISCELLANEOUS) ×1 IMPLANT
GLOVE BIOGEL PI IND STRL 7.0 (GLOVE) ×2 IMPLANT
GLOVE SS BIOGEL STRL SZ 6.5 (GLOVE) IMPLANT
LENS IOL RAYNER 20.5 (Intraocular Lens) ×1 IMPLANT
LENS IOL RAYONE EMV 20.5 (Intraocular Lens) IMPLANT
NDL HYPO 18GX1.5 BLUNT FILL (NEEDLE) ×1 IMPLANT
NEEDLE HYPO 18GX1.5 BLUNT FILL (NEEDLE) ×1 IMPLANT
PAD ARMBOARD 7.5X6 YLW CONV (MISCELLANEOUS) ×1 IMPLANT
SYR TB 1ML LL NO SAFETY (SYRINGE) ×1 IMPLANT
TAPE SURG TRANSPORE 1 IN (GAUZE/BANDAGES/DRESSINGS) IMPLANT
TAPE SURGICAL TRANSPORE 1 IN (GAUZE/BANDAGES/DRESSINGS) ×1
WATER STERILE IRR 250ML POUR (IV SOLUTION) ×1 IMPLANT

## 2022-01-28 NOTE — Transfer of Care (Signed)
Immediate Anesthesia Transfer of Care Note  Patient: Monique Lopez  Procedure(s) Performed: CATARACT EXTRACTION PHACO AND INTRAOCULAR LENS PLACEMENT (IOC) (Left: Eye)  Patient Location: PACU  Anesthesia Type:MAC  Level of Consciousness: awake, alert , and oriented  Airway & Oxygen Therapy: Patient Spontanous Breathing  Post-op Assessment: Report given to RN and Post -op Vital signs reviewed and stable  Post vital signs: Reviewed and stable  Last Vitals:  Vitals Value Taken Time  BP    Temp    Pulse    Resp    SpO2      Last Pain:  Vitals:   01/28/22 1026  TempSrc: Oral  PainSc: 0-No pain      Patients Stated Pain Goal: 8 (25/42/70 6237)  Complications: No notable events documented.

## 2022-01-28 NOTE — Anesthesia Preprocedure Evaluation (Signed)
Anesthesia Evaluation  Patient identified by MRN, date of birth, ID band Patient awake    Reviewed: Allergy & Precautions, H&P , NPO status , Patient's Chart, lab work & pertinent test results  History of Anesthesia Complications (+) PONV and history of anesthetic complications  Airway Mallampati: II  TM Distance: >3 FB Neck ROM: Full    Dental  (+) Dental Advisory Given, Edentulous Upper, Missing   Pulmonary COPD,  COPD inhaler, Current Smoker and Patient abstained from smoking.   Pulmonary exam normal breath sounds clear to auscultation       Cardiovascular negative cardio ROS Normal cardiovascular exam Rhythm:Regular Rate:Normal     Neuro/Psych negative neurological ROS  negative psych ROS   GI/Hepatic Neg liver ROS,GERD  Controlled,,  Endo/Other  Hypothyroidism    Renal/GU negative Renal ROS  negative genitourinary   Musculoskeletal  (+) Arthritis , Osteoarthritis,    Abdominal   Peds negative pediatric ROS (+)  Hematology negative hematology ROS (+)   Anesthesia Other Findings   Reproductive/Obstetrics negative OB ROS                              Anesthesia Physical Anesthesia Plan  ASA: 2  Anesthesia Plan: MAC   Post-op Pain Management: Minimal or no pain anticipated   Induction: Intravenous  PONV Risk Score and Plan: 1 and Treatment may vary due to age or medical condition  Airway Management Planned: Nasal Cannula and Natural Airway  Additional Equipment:   Intra-op Plan:   Post-operative Plan:   Informed Consent: I have reviewed the patients History and Physical, chart, labs and discussed the procedure including the risks, benefits and alternatives for the proposed anesthesia with the patient or authorized representative who has indicated his/her understanding and acceptance.     Dental advisory given  Plan Discussed with: CRNA and Surgeon  Anesthesia Plan  Comments:         Anesthesia Quick Evaluation

## 2022-01-28 NOTE — Anesthesia Postprocedure Evaluation (Signed)
Anesthesia Post Note  Patient: Monique Lopez  Procedure(s) Performed: CATARACT EXTRACTION PHACO AND INTRAOCULAR LENS PLACEMENT (IOC) (Left: Eye)  Patient location during evaluation: Phase II Anesthesia Type: MAC Level of consciousness: awake and alert and oriented Pain management: pain level controlled Vital Signs Assessment: post-procedure vital signs reviewed and stable Respiratory status: spontaneous breathing, nonlabored ventilation and respiratory function stable Cardiovascular status: blood pressure returned to baseline and stable Postop Assessment: no apparent nausea or vomiting Anesthetic complications: no  No notable events documented.   Last Vitals:  Vitals:   01/28/22 1026 01/28/22 1245  BP: 125/80 (!) 145/75  Pulse: 68 63  Resp: 16 16  Temp: 37.2 C 37.2 C  SpO2: 99% 100%    Last Pain:  Vitals:   01/28/22 1245  TempSrc: Oral  PainSc: 0-No pain                 Maricus Tanzi C Geoffrey Hynes

## 2022-01-28 NOTE — Discharge Instructions (Signed)
Please discharge patient when stable, will follow up today with Dr. Sonyia Muro at the [facility] [place] office immediately following discharge.  Leave shield in place until visit.  All paperwork with discharge instructions will be given at the office.   [facility] Address:  [address]  [place], [place] [address]  Dr. Felicia Bloomquist's Phone: [phone]  

## 2022-01-28 NOTE — Interval H&P Note (Signed)
History and Physical Interval Note:  01/28/2022 11:37 AM  The H and P was reviewed and updated. The patient was examined.  No changes were found after exam.  The surgical eye was marked.  Monique Lopez

## 2022-01-28 NOTE — Op Note (Signed)
Date of procedure: 01/28/22  Pre-operative diagnosis: Visually significant age-related nuclear cataract, Left Eye (H25.12)  Post-operative diagnosis: Visually significant age-related nuclear cataract, Left Eye  Procedure: Removal of cataract via phacoemulsification and insertion of intra-ocular lens Rayner [id number] +20.5D into the capsular bag of the Left Eye  Attending surgeon: Ronn Melena, MD  Anesthesia: MAC, Topical Akten  Complications: None  Estimated Blood Loss: <28m (minimal)  Specimens: None  Implants:  Implant Name Type Inv. Item Serial No. Manufacturer Lot No. LRB No. Used Action  LENS IOL RAYNER 20.5 - S25 Intraocular Lens LENS IOL RAYNER 20.5 25 SIGHTPATH [id number] Implanted    Indications:  Visually significant age-related cataract, Left Eye  Procedure:  The patient was seen and identified in the pre-operative area. The operative eye was identified and dilated.  The operative eye was marked.  Topical anesthesia was administered to the operative eye.     The patient was then to the operative suite and placed in the supine position.  A timeout was performed confirming the patient, procedure to be performed, and all other relevant information.   The patient's face was prepped and draped in the usual fashion for intra-ocular surgery.  A lid speculum was placed into the operative eye and the surgical microscope moved into place and focused.  An inferotemporal paracentesis was created using a 20 gauge paracentesis blade.  BSS mixed with Omidria, followed by 1% lidocaine was injected into the anterior chamber.  Viscoelastic was injected into the anterior chamber.  A temporal clear-corneal main wound incision was created using a 2.438mmicrokeratome.  A continuous curvilinear capsulorrhexis was initiated using an irrigating cystitome and completed using capsulorrhexis forceps.  Hydrodissection and hydrodeliniation were performed.  Viscoelastic was injected into the  anterior chamber.  A phacoemulsification handpiece and a chopper as a second instrument were used to remove the nucleus and epinucleus. The irrigation/aspiration handpiece was used to remove any remaining cortical material.   The capsular bag was reinflated with viscoelastic, checked, and found to be intact.  The intraocular lens was inserted into the capsular bag.  The irrigation/aspiration handpiece was used to remove any remaining viscoelastic.  The clear corneal wound and paracentesis wounds were then hydrated and checked with Weck-Cels to be watertight.  The lid-speculum and drape was removed, and the patient's face was cleaned with a wet and dry 4x4.  Moxifloxacin was instilled onto the eye. A clear shield was taped over the eye. The patient was taken to the post-operative care unit in good condition, having tolerated the procedure well.  Post-Op Instructions: [facility] and will receive all other orders and instructions.

## 2022-01-31 ENCOUNTER — Encounter (HOSPITAL_COMMUNITY): Payer: Self-pay | Admitting: Optometry

## 2022-03-31 DIAGNOSIS — I10 Essential (primary) hypertension: Secondary | ICD-10-CM | POA: Diagnosis not present

## 2022-03-31 DIAGNOSIS — E782 Mixed hyperlipidemia: Secondary | ICD-10-CM | POA: Diagnosis not present

## 2022-03-31 DIAGNOSIS — Z681 Body mass index (BMI) 19 or less, adult: Secondary | ICD-10-CM | POA: Diagnosis not present

## 2022-03-31 DIAGNOSIS — E890 Postprocedural hypothyroidism: Secondary | ICD-10-CM | POA: Diagnosis not present

## 2022-04-29 ENCOUNTER — Other Ambulatory Visit (HOSPITAL_COMMUNITY): Payer: Self-pay | Admitting: Family Medicine

## 2022-04-29 ENCOUNTER — Encounter (HOSPITAL_COMMUNITY): Payer: Self-pay | Admitting: Family Medicine

## 2022-04-29 ENCOUNTER — Encounter (HOSPITAL_COMMUNITY): Payer: Self-pay | Admitting: Chiropractic Medicine

## 2022-04-29 DIAGNOSIS — Z1231 Encounter for screening mammogram for malignant neoplasm of breast: Secondary | ICD-10-CM

## 2022-04-29 DIAGNOSIS — Z681 Body mass index (BMI) 19 or less, adult: Secondary | ICD-10-CM | POA: Diagnosis not present

## 2022-04-29 DIAGNOSIS — Z0000 Encounter for general adult medical examination without abnormal findings: Secondary | ICD-10-CM | POA: Diagnosis not present

## 2022-05-05 ENCOUNTER — Other Ambulatory Visit (HOSPITAL_COMMUNITY): Payer: Self-pay | Admitting: Family Medicine

## 2022-05-05 DIAGNOSIS — Z0000 Encounter for general adult medical examination without abnormal findings: Secondary | ICD-10-CM

## 2022-05-05 DIAGNOSIS — F17200 Nicotine dependence, unspecified, uncomplicated: Secondary | ICD-10-CM

## 2022-05-18 ENCOUNTER — Ambulatory Visit (HOSPITAL_COMMUNITY)
Admission: RE | Admit: 2022-05-18 | Discharge: 2022-05-18 | Disposition: A | Discharge status: Home / Self Care | Payer: Medicare Other | Source: Ambulatory Visit | Attending: Family Medicine | Admitting: Family Medicine

## 2022-05-18 ENCOUNTER — Encounter (HOSPITAL_COMMUNITY): Payer: Self-pay

## 2022-05-18 DIAGNOSIS — Z1231 Encounter for screening mammogram for malignant neoplasm of breast: Secondary | ICD-10-CM

## 2022-05-27 ENCOUNTER — Ambulatory Visit (HOSPITAL_COMMUNITY)
Admission: RE | Admit: 2022-05-27 | Discharge: 2022-05-27 | Disposition: A | Discharge status: Home / Self Care | Payer: Medicare Other | Source: Ambulatory Visit | Attending: Family Medicine | Admitting: Family Medicine

## 2022-05-27 DIAGNOSIS — F17210 Nicotine dependence, cigarettes, uncomplicated: Secondary | ICD-10-CM | POA: Diagnosis not present

## 2022-05-27 DIAGNOSIS — Z122 Encounter for screening for malignant neoplasm of respiratory organs: Secondary | ICD-10-CM | POA: Diagnosis not present

## 2022-05-27 DIAGNOSIS — F17200 Nicotine dependence, unspecified, uncomplicated: Secondary | ICD-10-CM | POA: Insufficient documentation

## 2022-05-27 DIAGNOSIS — Z0000 Encounter for general adult medical examination without abnormal findings: Secondary | ICD-10-CM | POA: Diagnosis not present

## 2022-06-09 ENCOUNTER — Ambulatory Visit: Payer: Medicare Other | Attending: Internal Medicine | Admitting: Internal Medicine

## 2022-06-09 ENCOUNTER — Encounter: Payer: Self-pay | Admitting: Internal Medicine

## 2022-06-09 VITALS — BP 158/90 | HR 66 | Ht 65.0 in | Wt 126.0 lb

## 2022-06-09 DIAGNOSIS — I2510 Atherosclerotic heart disease of native coronary artery without angina pectoris: Secondary | ICD-10-CM | POA: Diagnosis not present

## 2022-06-09 DIAGNOSIS — Z136 Encounter for screening for cardiovascular disorders: Secondary | ICD-10-CM

## 2022-06-09 DIAGNOSIS — Z720 Tobacco use: Secondary | ICD-10-CM | POA: Diagnosis not present

## 2022-06-09 DIAGNOSIS — Z01810 Encounter for preprocedural cardiovascular examination: Secondary | ICD-10-CM

## 2022-06-09 DIAGNOSIS — R030 Elevated blood-pressure reading, without diagnosis of hypertension: Secondary | ICD-10-CM | POA: Diagnosis not present

## 2022-06-09 MED ORDER — METOPROLOL TARTRATE 100 MG PO TABS: 100.0000 mg | ORAL_TABLET | Freq: Once | ORAL | 0 refills | Status: AC

## 2022-06-09 NOTE — Patient Instructions (Addendum)
Medication Instructions:  Your physician recommends that you continue on your current medications as directed. Please refer to the Current Medication list given to you today.  Labwork: BMET in 1-2 weeks at [facility] ([address] [place]. Or off [address] [place]) Non-fasting  Testing/[employer]: Coronary CTA-see instructions below  Follow-Up: Your physician recommends that you schedule a follow-up appointment in: pending  Any Other Special Instructions Will Be Listed Below (If Applicable).  If you need a refill on your cardiac medications before your next appointment, please call your pharmacy.    Your cardiac CT will be scheduled at one of the below locations:   [facility] [address] [place], [place] [address] [phone]  If scheduled at [facility], please arrive at the [facility] and Children's Entrance (Entrance C2) of [facility] 30 minutes prior to test start time. You can use the FREE valet parking offered at entrance C (encouraged to control the heart rate for the test)  Proceed to the [facility] Radiology Department (first floor) to check-in and test prep.  All radiology patients and guests should use entrance C2 at [facility], accessed from [facility], even though the hospital's physical address listed is [address].    Please follow these instructions carefully (unless otherwise directed):  On the Night Before the Test: Be sure to Drink plenty of water. Do not consume any caffeinated/decaffeinated beverages or chocolate 12 hours prior to your test. Do not take any antihistamines 12 hours prior to your test. If the patient has contrast allergy: No contrast allergy  On the Day of the Test: Drink plenty of water until 1 hour prior to the test. Do not eat any food 1 hour prior to test. You may take your regular medications prior to the test.  Take metoprolol (Lopressor) 100 mg two hours prior to  test. FEMALES- please wear underwire-free bra if available, avoid dresses & tight clothing      After the Test: Drink plenty of water. After receiving IV contrast, you may experience a mild flushed feeling. This is normal. On occasion, you may experience a mild rash up to 24 hours after the test. This is not dangerous. If this occurs, you can take Benadryl 25 mg and increase your fluid intake. If you experience trouble breathing, this can be serious. If it is severe call 911 IMMEDIATELY. If it is mild, please call our office.  We will call to schedule your test 2-4 weeks out understanding that some insurance companies will need an authorization prior to the service being performed.   For non-scheduling related questions, please contact the cardiac imaging nurse navigator should you have any questions/concerns: Rockwell Alexandria, Cardiac Imaging Nurse Navigator Larey Brick, Cardiac Imaging Nurse Navigator [facility] Direct Office Dial: [phone]   For scheduling needs, including cancellations and rescheduling, please call [place], [phone].

## 2022-06-09 NOTE — Progress Notes (Signed)
Cardiology Office Note  Date: 06/09/2022   ID: Monique Lopez, DOB February 23, 1950, MRN [id number]  PCP:  Assunta Found, MD  Cardiologist:  None Electrophysiologist:  None   Reason for Office Visit: Evaluation of coronary artery calcifications at the request of Dr. Phillips Odor   History of Present Illness: Monique Lopez is a 72 y.o. female known to have COPD, nicotine abuse was referred to cardiology for regular evaluation of coronary calcifications noticed on the CAT scan.  Patient underwent CT chest lung cancer screening on 05/27/2022 that showed severe coronary artery calcifications for which cardiology referral was placed.  Patient denies any angina, DOE, claudication, dizziness, syncope.  Currently smokes 1 pack/day but she is ready to quit.  Past Medical History:  Diagnosis Date   Allergy    Anxiety    Arthritis    hands, knees   Complication of anesthesia    Emphysema of lung (HCC)    GERD (gastroesophageal reflux disease)    diet controlled   HSV infection    Hyperlipidemia    Medical history reviewed with no changes    PONV (postoperative nausea and vomiting)    Smoker    1 ppd plus x 40 plus years   Thyroid disease    goiter- md is monitoring    Past Surgical History:  Procedure Laterality Date   CATARACT EXTRACTION W/PHACO Right 12/31/2021   Procedure: CATARACT EXTRACTION PHACO AND INTRAOCULAR LENS PLACEMENT (IOC);  Surgeon: Pecolia Ades, MD;  Location: [facility];  Service: Ophthalmology;  Laterality: Right;  CDE - 23.77   CATARACT EXTRACTION W/PHACO Left 01/28/2022   Procedure: CATARACT EXTRACTION PHACO AND INTRAOCULAR LENS PLACEMENT (IOC);  Surgeon: Pecolia Ades, MD;  Location: [facility];  Service: Ophthalmology;  Laterality: Left;  CDE: 7.40   CHOLECYSTECTOMY     DILATION AND CURETTAGE OF UTERUS     GALLBLADDER SURGERY  2001   THYROID LOBECTOMY Left 03/08/2018   Procedure: LEFT THYROID LOBECTOMY;  Surgeon: Darnell Level, MD;  Location: [facility];  Service:  General;  Laterality: Left;   THYROIDECTOMY N/A 03/26/2018   Procedure: COMPLETION THYROIDECTOMY;  Surgeon: Darnell Level, MD;  Location: [facility];  Service: General;  Laterality: N/A;    Current Outpatient Medications  Medication Sig Dispense Refill   ALPRAZolam (XANAX) 0.25 MG tablet Take 0.125 mg by mouth at bedtime.     atorvastatin (LIPITOR) 40 MG tablet Take 1 tablet (40 mg total) by mouth daily. (Patient taking differently: Take 40 mg by mouth at bedtime.) 30 tablet 0   calcium carbonate (TUMS) 500 MG chewable tablet Chew 2 tablets (400 mg of elemental calcium total) by mouth 2 (two) times daily. (Patient not taking: Reported on 12/28/2021) 90 tablet 1   levothyroxine (SYNTHROID) 137 MCG tablet Take 1 tablet (137 mcg total) by mouth daily before breakfast. 90 tablet 3   tetrahydrozoline (VISINE) 0.05 % ophthalmic solution Place 2 drops into both eyes daily as needed (for dry eyes).     No current facility-administered medications for this visit.   Allergies:  Amoxicillin   Social History: The patient  reports that she has been smoking cigarettes. She has a 40.00 pack-year smoking history. She has never used smokeless tobacco. She reports current alcohol use. She reports that she does not use drugs.   Family History: The patient's family history includes Arthritis in her mother; Esophageal cancer in her maternal uncle; Stroke in her mother.   ROS:  Please see the history of present illness. Otherwise, complete  review of systems is positive for none  All other systems are reviewed and negative.   Physical Exam: VS:  There were no vitals taken for this visit., BMI There is no height or weight on file to calculate BMI.  Wt Readings from Last 3 Encounters:  01/28/22 121 lb 0.5 oz (54.9 kg)  01/25/22 121 lb 0.5 oz (54.9 kg)  12/31/21 121 lb (54.9 kg)    General: Patient appears comfortable at rest. HEENT: Conjunctiva and lids normal, oropharynx clear with moist mucosa. Neck: Supple, no  elevated JVP or carotid bruits, no thyromegaly. Lungs: Clear to auscultation, nonlabored breathing at rest. Cardiac: Regular rate and rhythm, no S3 or significant systolic murmur, no pericardial rub. Abdomen: Soft, nontender, no hepatomegaly, bowel sounds present, no guarding or rebound. Extremities: No pitting edema, distal pulses 2+. Skin: Warm and dry. Musculoskeletal: No kyphosis. Neuropsychiatric: Alert and oriented x3, affect grossly appropriate.  Recent Labwork: No results found for requested labs within last 365 days.     Component Value Date/Time   CHOL 192 03/18/2015 0835   TRIG 114 03/18/2015 0835   HDL 49 03/18/2015 0835   CHOLHDL 3.9 03/18/2015 0835   VLDL 23 03/18/2015 0835   LDLCALC 120 (H) 03/18/2015 0835    Other Studies Reviewed Today:   Assessment and Plan: Patient is a 72 year old F known to have COPD, nicotine abuse was referred to cardiology clinic for evaluation of coronary calcifications.  # Severe coronary artery calcifications on CT chest: Obtain CTA cardiac.  If FFR is positive, she will be seen every 1 year and evaluate for any new development of angina or DOE. # Nicotine abuse: Currently smokes 1 pack/day, interested to quit. Smoking cessation instruction/counseling given:  counseled patient on the dangers of tobacco use, advised patient to stop smoking, and reviewed strategies to maximize success  # Elevated blood pressure reading without diagnosis of HTN: Instructed patient to check her blood pressures in a.m. and p.m. at home.  She says she is nervous today as she has never seen a cardiologist in her life.   I have spent a total of 45 minutes with patient reviewing chart, EKGs, labs and examining patient as well as establishing an assessment and plan that was discussed with the patient.  > 50% of time was spent in direct patient care.    Medication Adjustments/Labs and Tests Ordered: Current medicines are reviewed at length with the patient today.   Concerns regarding medicines are outlined above.   Tests Ordered: Orders Placed This Encounter  Procedures   CT CORONARY MORPH W/CTA COR W/SCORE W/CA W/CM &/OR WO/CM   Basic metabolic panel   EKG 12-Lead      Medication Changes: Meds ordered this encounter  Medications   metoprolol tartrate (LOPRESSOR) 100 MG tablet    Sig: Take 1 tablet (100 mg total) by mouth once for 1 dose. 2 hours before your CT scan    Dispense:  1 tablet    Refill:  0      Disposition:  Follow up  pending results  Signed Alesa Echevarria Verne Spurr, MD, 06/09/2022 9:36 AM    [facility] at [facility] [address] [place], [place], [place] [address]

## 2022-06-13 DIAGNOSIS — I2510 Atherosclerotic heart disease of native coronary artery without angina pectoris: Secondary | ICD-10-CM | POA: Diagnosis not present

## 2022-06-13 DIAGNOSIS — Z01810 Encounter for preprocedural cardiovascular examination: Secondary | ICD-10-CM | POA: Diagnosis not present

## 2022-06-14 ENCOUNTER — Telehealth: Payer: Self-pay | Admitting: Internal Medicine

## 2022-06-14 LAB — BASIC METABOLIC PANEL
BUN/Creatinine Ratio: 22 (ref 12–28)
BUN: 14 mg/dL (ref 8–27)
CO2: 25 mmol/L (ref 20–29)
Calcium: 10 mg/dL (ref 8.7–10.3)
Chloride: 104 mmol/L (ref 96–106)
Creatinine, Ser: 0.64 mg/dL (ref 0.57–1.00)
Glucose: 86 mg/dL (ref 70–99)
Potassium: 5.5 mmol/L — ABNORMAL HIGH (ref 3.5–5.2)
Sodium: 144 mmol/L (ref 134–144)
eGFR: 96 mL/min/{1.73_m2} (ref >59)

## 2022-06-14 NOTE — Telephone Encounter (Signed)
Pt c/o BP issue: STAT if pt c/o blurred vision, one-sided weakness or slurred speech  1. What are your last 5 BP readings?   159/109 168/98  2. Are you having any other symptoms (ex. Dizziness, headache, blurred vision, passed out)? Dizziness   3. What is your BP issue? Pt states Dr. Jenene Slicker told her to keep an eye out for her bp and it has been running high today. Please advise.

## 2022-06-15 ENCOUNTER — Encounter: Payer: Self-pay | Admitting: Internal Medicine

## 2022-06-15 NOTE — Telephone Encounter (Signed)
Pt calling back for update  

## 2022-06-15 NOTE — Telephone Encounter (Signed)
Patient called this morning to follow up on BP readings. Patient stated that her BP this morning was 146/84. Please advise. Patient stated yesterday patient was dizzy but today she states so far she does not feel dizzy.

## 2022-06-16 ENCOUNTER — Telehealth (HOSPITAL_COMMUNITY): Payer: Self-pay | Admitting: Emergency Medicine

## 2022-06-16 NOTE — Telephone Encounter (Signed)
Advised to get new BP monitor since BP has been fluctuating with high and low numbers and unsure if she has accurate readings. Advised to monitor home BP for 2 weeks, using same arm, same cuff and same time of day, and contact our office with readings to determine if she needed to start amlodipine. Verbalized understanding.

## 2022-06-16 NOTE — Telephone Encounter (Signed)
Reaching out to patient to offer assistance regarding upcoming cardiac imaging study; pt verbalizes understanding of appt date/time, parking situation and where to check in, pre-test NPO status and medications ordered, and verified current allergies; name and call back number provided for further questions should they arise Asahel Risden RN Navigator Cardiac Imaging [place] [facility] [phone] office [phone] cell 

## 2022-06-16 NOTE — Telephone Encounter (Signed)
Patient made aware. States that when she woke up this morning, her BP was 86/71. Took BP 1.5 hrs after waking  up. This is the first time her BP has ever been that low. Took BP yesterday twice while at [facility], 1st reading which she does not remember, was high and the 2nd not time, not as high. States that she does not have any dizziness, SOB or chest pains but is unsure as to why her BP is up and down. Please advise as to whether she needs to still consider taking new medication?

## 2022-06-17 ENCOUNTER — Ambulatory Visit (HOSPITAL_COMMUNITY)
Admission: RE | Admit: 2022-06-17 | Discharge: 2022-06-17 | Disposition: A | Discharge status: Home / Self Care | Payer: Medicare Other | Source: Ambulatory Visit | Attending: Internal Medicine | Admitting: Internal Medicine

## 2022-06-17 DIAGNOSIS — I2510 Atherosclerotic heart disease of native coronary artery without angina pectoris: Secondary | ICD-10-CM | POA: Diagnosis not present

## 2022-06-17 DIAGNOSIS — I700 Atherosclerosis of aorta: Secondary | ICD-10-CM

## 2022-06-17 MED ORDER — IOHEXOL 350 MG/ML SOLN
100.0000 mL | Freq: Once | INTRAVENOUS | Status: AC | PRN
  Administered 2022-06-17: 100 mL via INTRAVENOUS

## 2022-06-17 MED ORDER — NITROGLYCERIN 0.4 MG SL SUBL
0.8000 mg | SUBLINGUAL_TABLET | Freq: Once | SUBLINGUAL | Status: AC
  Administered 2022-06-17: 0.8 mg via SUBLINGUAL

## 2022-06-17 MED ORDER — NITROGLYCERIN 0.4 MG SL SUBL
SUBLINGUAL_TABLET | SUBLINGUAL | Status: AC
  Filled 2022-06-17: qty 2

## 2022-06-20 ENCOUNTER — Telehealth: Payer: Self-pay | Admitting: *Deleted

## 2022-06-20 ENCOUNTER — Encounter: Payer: Self-pay | Admitting: Internal Medicine

## 2022-06-20 DIAGNOSIS — E875 Hyperkalemia: Secondary | ICD-10-CM

## 2022-06-20 NOTE — Telephone Encounter (Signed)
Patient informed and verbalized understanding of plan. Copy sent to PCP Lab order faxed to [facility]

## 2022-06-20 NOTE — Telephone Encounter (Signed)
-----   Message from Marjo Bicker, MD sent at 06/17/2022  2:25 PM EDT ----- Serum potassium levels are mildly elevated. Obtain repeat BMP.

## 2022-06-22 ENCOUNTER — Other Ambulatory Visit (HOSPITAL_COMMUNITY)
Admission: RE | Admit: 2022-06-22 | Discharge: 2022-06-22 | Disposition: A | Discharge status: Home / Self Care | Payer: Medicare Other | Source: Ambulatory Visit | Attending: Internal Medicine | Admitting: Internal Medicine

## 2022-06-22 DIAGNOSIS — E875 Hyperkalemia: Secondary | ICD-10-CM | POA: Insufficient documentation

## 2022-06-22 LAB — BASIC METABOLIC PANEL
Anion gap: 8 (ref 5–15)
BUN: 13 mg/dL (ref 8–23)
CO2: 29 mmol/L (ref 22–32)
Calcium: 9.3 mg/dL (ref 8.9–10.3)
Chloride: 102 mmol/L (ref 98–111)
Creatinine, Ser: 0.74 mg/dL (ref 0.44–1.00)
GFR, Estimated: >60 mL/min (ref >60)
Glucose, Bld: 93 mg/dL (ref 70–99)
Potassium: 4.4 mmol/L (ref 3.5–5.1)
Sodium: 139 mmol/L (ref 135–145)

## 2022-07-01 DIAGNOSIS — I10 Essential (primary) hypertension: Secondary | ICD-10-CM | POA: Diagnosis not present

## 2022-07-01 DIAGNOSIS — I2510 Atherosclerotic heart disease of native coronary artery without angina pectoris: Secondary | ICD-10-CM | POA: Diagnosis not present

## 2022-07-01 DIAGNOSIS — I2584 Coronary atherosclerosis due to calcified coronary lesion: Secondary | ICD-10-CM | POA: Diagnosis not present

## 2022-07-01 DIAGNOSIS — F17200 Nicotine dependence, unspecified, uncomplicated: Secondary | ICD-10-CM | POA: Diagnosis not present

## 2022-10-19 DIAGNOSIS — E890 Postprocedural hypothyroidism: Secondary | ICD-10-CM | POA: Diagnosis not present

## 2022-10-19 DIAGNOSIS — I2584 Coronary atherosclerosis due to calcified coronary lesion: Secondary | ICD-10-CM | POA: Diagnosis not present

## 2022-10-19 DIAGNOSIS — F17200 Nicotine dependence, unspecified, uncomplicated: Secondary | ICD-10-CM | POA: Diagnosis not present

## 2022-10-19 DIAGNOSIS — E7849 Other hyperlipidemia: Secondary | ICD-10-CM | POA: Diagnosis not present

## 2022-10-19 DIAGNOSIS — Z681 Body mass index (BMI) 19 or less, adult: Secondary | ICD-10-CM | POA: Diagnosis not present

## 2022-10-19 DIAGNOSIS — E782 Mixed hyperlipidemia: Secondary | ICD-10-CM | POA: Diagnosis not present

## 2023-02-01 DIAGNOSIS — J329 Chronic sinusitis, unspecified: Secondary | ICD-10-CM | POA: Diagnosis not present

## 2023-02-01 DIAGNOSIS — Z6820 Body mass index (BMI) 20.0-20.9, adult: Secondary | ICD-10-CM | POA: Diagnosis not present

## 2023-02-01 DIAGNOSIS — F17200 Nicotine dependence, unspecified, uncomplicated: Secondary | ICD-10-CM | POA: Diagnosis not present

## 2023-05-19 ENCOUNTER — Other Ambulatory Visit (HOSPITAL_COMMUNITY): Payer: Self-pay | Admitting: Family Medicine

## 2023-05-19 DIAGNOSIS — R911 Solitary pulmonary nodule: Secondary | ICD-10-CM

## 2023-05-25 DIAGNOSIS — R6889 Other general symptoms and signs: Secondary | ICD-10-CM | POA: Diagnosis not present

## 2023-05-25 DIAGNOSIS — Z681 Body mass index (BMI) 19 or less, adult: Secondary | ICD-10-CM | POA: Diagnosis not present

## 2023-05-25 DIAGNOSIS — J302 Other seasonal allergic rhinitis: Secondary | ICD-10-CM | POA: Diagnosis not present

## 2023-06-13 DIAGNOSIS — I10 Essential (primary) hypertension: Secondary | ICD-10-CM | POA: Diagnosis not present

## 2023-06-13 DIAGNOSIS — F17200 Nicotine dependence, unspecified, uncomplicated: Secondary | ICD-10-CM | POA: Diagnosis not present

## 2023-06-13 DIAGNOSIS — E039 Hypothyroidism, unspecified: Secondary | ICD-10-CM | POA: Diagnosis not present

## 2023-06-13 DIAGNOSIS — E782 Mixed hyperlipidemia: Secondary | ICD-10-CM | POA: Diagnosis not present

## 2023-06-13 DIAGNOSIS — R7309 Other abnormal glucose: Secondary | ICD-10-CM | POA: Diagnosis not present

## 2023-06-13 DIAGNOSIS — E890 Postprocedural hypothyroidism: Secondary | ICD-10-CM | POA: Diagnosis not present

## 2023-06-13 DIAGNOSIS — I2584 Coronary atherosclerosis due to calcified coronary lesion: Secondary | ICD-10-CM | POA: Diagnosis not present

## 2023-06-13 DIAGNOSIS — Z681 Body mass index (BMI) 19 or less, adult: Secondary | ICD-10-CM | POA: Diagnosis not present

## 2023-06-13 DIAGNOSIS — E7849 Other hyperlipidemia: Secondary | ICD-10-CM | POA: Diagnosis not present

## 2023-06-13 DIAGNOSIS — Z0000 Encounter for general adult medical examination without abnormal findings: Secondary | ICD-10-CM | POA: Diagnosis not present

## 2023-06-13 DIAGNOSIS — R6889 Other general symptoms and signs: Secondary | ICD-10-CM | POA: Diagnosis not present

## 2023-07-05 ENCOUNTER — Ambulatory Visit (HOSPITAL_COMMUNITY)

## 2023-07-05 ENCOUNTER — Encounter (HOSPITAL_COMMUNITY): Payer: Self-pay

## 2023-08-22 DIAGNOSIS — Z681 Body mass index (BMI) 19 or less, adult: Secondary | ICD-10-CM | POA: Diagnosis not present

## 2023-08-22 DIAGNOSIS — L0212 Furuncle of neck: Secondary | ICD-10-CM | POA: Diagnosis not present

## 2023-08-24 ENCOUNTER — Other Ambulatory Visit (HOSPITAL_COMMUNITY): Payer: Self-pay | Admitting: Family Medicine

## 2023-08-24 DIAGNOSIS — Z1231 Encounter for screening mammogram for malignant neoplasm of breast: Secondary | ICD-10-CM

## 2023-09-28 ENCOUNTER — Ambulatory Visit (HOSPITAL_COMMUNITY)
Admission: RE | Admit: 2023-09-28 | Discharge: 2023-09-28 | Disposition: A | Discharge status: Home / Self Care | Source: Ambulatory Visit | Attending: Family Medicine | Admitting: Family Medicine

## 2023-09-28 ENCOUNTER — Encounter (HOSPITAL_COMMUNITY): Payer: Self-pay

## 2023-09-28 DIAGNOSIS — D485 Neoplasm of uncertain behavior of skin: Secondary | ICD-10-CM | POA: Diagnosis not present

## 2023-09-28 DIAGNOSIS — L570 Actinic keratosis: Secondary | ICD-10-CM | POA: Diagnosis not present

## 2023-09-28 DIAGNOSIS — R911 Solitary pulmonary nodule: Secondary | ICD-10-CM | POA: Diagnosis not present

## 2023-09-28 DIAGNOSIS — L723 Sebaceous cyst: Secondary | ICD-10-CM | POA: Diagnosis not present

## 2023-09-28 DIAGNOSIS — J432 Centrilobular emphysema: Secondary | ICD-10-CM | POA: Diagnosis not present

## 2023-09-28 DIAGNOSIS — I700 Atherosclerosis of aorta: Secondary | ICD-10-CM | POA: Diagnosis not present

## 2023-09-28 DIAGNOSIS — C441192 Basal cell carcinoma of skin of left lower eyelid, including canthus: Secondary | ICD-10-CM | POA: Diagnosis not present

## 2023-09-28 DIAGNOSIS — Z1231 Encounter for screening mammogram for malignant neoplasm of breast: Secondary | ICD-10-CM | POA: Insufficient documentation

## 2023-10-05 DIAGNOSIS — C441192 Basal cell carcinoma of skin of left lower eyelid, including canthus: Secondary | ICD-10-CM | POA: Diagnosis not present
